# Patient Record
Sex: Female | Born: 1965 | Race: White | Hispanic: No | Marital: Single | Smoking: Never smoker
Health system: Southern US, Community
[De-identification: ages and names within clinical notes are randomized; demographics above are authoritative.]

## PROBLEM LIST (undated history)

## (undated) DIAGNOSIS — F329 Major depressive disorder, single episode, unspecified: Secondary | ICD-10-CM

## (undated) DIAGNOSIS — F419 Anxiety disorder, unspecified: Secondary | ICD-10-CM

## (undated) DIAGNOSIS — F32A Depression, unspecified: Secondary | ICD-10-CM

## (undated) DIAGNOSIS — T7840XA Allergy, unspecified, initial encounter: Secondary | ICD-10-CM

## (undated) HISTORY — DX: Allergy, unspecified, initial encounter: T78.40XA

## (undated) HISTORY — DX: Anxiety disorder, unspecified: F41.9

## (undated) HISTORY — DX: Major depressive disorder, single episode, unspecified: F32.9

## (undated) HISTORY — DX: Depression, unspecified: F32.A

---

## 1999-05-07 ENCOUNTER — Emergency Department (HOSPITAL_COMMUNITY): Admission: EM | Admit: 1999-05-07 | Discharge: 1999-05-07 | Payer: Self-pay | Admitting: Emergency Medicine

## 2000-10-13 ENCOUNTER — Other Ambulatory Visit: Admission: RE | Admit: 2000-10-13 | Discharge: 2000-10-13 | Payer: Self-pay | Admitting: Emergency Medicine

## 2001-11-25 ENCOUNTER — Encounter: Admission: RE | Admit: 2001-11-25 | Discharge: 2001-11-25 | Payer: Self-pay | Admitting: Emergency Medicine

## 2001-11-25 ENCOUNTER — Encounter: Payer: Self-pay | Admitting: Emergency Medicine

## 2002-11-09 HISTORY — PX: BUNIONECTOMY: SHX129

## 2003-12-01 ENCOUNTER — Emergency Department (HOSPITAL_COMMUNITY): Admission: EM | Admit: 2003-12-01 | Discharge: 2003-12-01 | Payer: Self-pay | Admitting: Emergency Medicine

## 2005-11-17 ENCOUNTER — Encounter: Admission: RE | Admit: 2005-11-17 | Discharge: 2005-11-17 | Payer: Self-pay | Admitting: Emergency Medicine

## 2007-04-29 ENCOUNTER — Encounter: Admission: RE | Admit: 2007-04-29 | Discharge: 2007-04-29 | Payer: Self-pay | Admitting: Emergency Medicine

## 2009-12-20 ENCOUNTER — Encounter: Admission: RE | Admit: 2009-12-20 | Discharge: 2009-12-20 | Payer: Self-pay | Admitting: Obstetrics

## 2010-12-04 ENCOUNTER — Other Ambulatory Visit: Payer: Self-pay | Admitting: Obstetrics

## 2010-12-04 DIAGNOSIS — Z1239 Encounter for other screening for malignant neoplasm of breast: Secondary | ICD-10-CM

## 2010-12-26 ENCOUNTER — Ambulatory Visit: Payer: Self-pay

## 2011-01-09 ENCOUNTER — Ambulatory Visit: Payer: Self-pay

## 2011-01-16 ENCOUNTER — Ambulatory Visit
Admission: RE | Admit: 2011-01-16 | Discharge: 2011-01-16 | Disposition: A | Discharge status: Home / Self Care | Payer: 59 | Source: Ambulatory Visit | Attending: Obstetrics | Admitting: Obstetrics

## 2011-01-16 DIAGNOSIS — Z1239 Encounter for other screening for malignant neoplasm of breast: Secondary | ICD-10-CM

## 2012-02-11 ENCOUNTER — Other Ambulatory Visit: Payer: Self-pay | Admitting: Obstetrics

## 2012-02-11 DIAGNOSIS — Z1231 Encounter for screening mammogram for malignant neoplasm of breast: Secondary | ICD-10-CM

## 2012-02-19 ENCOUNTER — Ambulatory Visit
Admission: RE | Admit: 2012-02-19 | Discharge: 2012-02-19 | Disposition: A | Discharge status: Home / Self Care | Payer: 59 | Source: Ambulatory Visit | Attending: Obstetrics | Admitting: Obstetrics

## 2012-02-19 DIAGNOSIS — Z1231 Encounter for screening mammogram for malignant neoplasm of breast: Secondary | ICD-10-CM

## 2012-11-04 ENCOUNTER — Ambulatory Visit: Payer: 59 | Admitting: Emergency Medicine

## 2012-11-04 VITALS — BP 121/71 | HR 76 | Temp 98.1°F | Resp 16 | Ht 66.25 in | Wt 150.8 lb

## 2012-11-04 DIAGNOSIS — J018 Other acute sinusitis: Secondary | ICD-10-CM

## 2012-11-04 DIAGNOSIS — J209 Acute bronchitis, unspecified: Secondary | ICD-10-CM

## 2012-11-04 MED ORDER — AMOXICILLIN-POT CLAVULANATE 875-125 MG PO TABS: 1.0000 | ORAL_TABLET | Freq: Two times a day (BID) | ORAL | Status: DC

## 2012-11-04 MED ORDER — HYDROCOD POLST-CHLORPHEN POLST 10-8 MG/5ML PO LQCR: 5.0000 mL | Freq: Two times a day (BID) | ORAL | Status: DC | PRN

## 2012-11-04 MED ORDER — PSEUDOEPHEDRINE-GUAIFENESIN ER 60-600 MG PO TB12: 1.0000 | ORAL_TABLET | Freq: Two times a day (BID) | ORAL | Status: DC

## 2012-11-04 NOTE — Progress Notes (Signed)
Reviewed and agree.

## 2012-11-04 NOTE — Patient Instructions (Addendum)

## 2012-11-04 NOTE — Progress Notes (Signed)
[facility] [address], [place] [address] [phone]  Date:  11/04/2012   Name:  Laura [name]   DOB:  [birth year]   MRN:  [id number]  PCP:  No primary provider on file.    Chief Complaint: Sinusitis   History of Present Illness:  Laura [name] is a 46 y.o. very pleasant female patient who presents with the following:  Several day history of nasal congestion and purulent drainage and discharge.  Has pressure in cheeks and forehead and feels pressure in eyes. Has scratchy voice and hoarse at times.  Has a cough productive purulent material.  Began after exposure to vermiculite dust on the floor of a trailer on Friday.  No fever but feels chilled.  No improvement with OTC medication.  Has headache and fatigue.  There is no problem list on file for this patient.   Past Medical History  Diagnosis Date  . Allergy   . Anxiety   . Depression     History reviewed. No pertinent past surgical history.  History  Substance Use Topics  . Smoking status: Never Smoker   . Smokeless tobacco: Not on file  . Alcohol Use: No    Family History  Problem Relation Age of Onset  . Stroke Mother   . Scoliosis Mother   . Heart disease Maternal Grandmother   . Stroke Maternal Grandmother   . Cancer Paternal Grandmother   . Heart disease Paternal Grandfather     Allergies  Allergen Reactions  . Flagyl (Metronidazole) Rash    Medication list has been reviewed and updated.  Current Outpatient Prescriptions on File Prior to Visit  Medication Sig Dispense Refill  . lisdexamfetamine (VYVANSE) 50 MG capsule Take 50 mg by mouth every morning.        Review of Systems:  As per HPI, otherwise negative.    Physical Examination: Filed Vitals:   11/04/12 1141  BP: 121/71  Pulse: 76  Temp: 98.1 F (36.7 C)  Resp: 16   Filed Vitals:   11/04/12 1141  Height: 5' 6.25" (1.683 m)  Weight: 150 lb 12.8 oz (68.402 kg)   Body mass index is 24.16 kg/(m^2). Ideal  Body Weight: Weight in (lb) to have BMI = 25: 155.7   GEN: WDWN, NAD, Non-toxic, A & O x 3  No rash or shortness of breath HEENT: Atraumatic, Normocephalic. Neck supple. No masses, No LAD.  Oropharynx negative Ears and Nose: No external deformity.  TM negative.  Green nasal drainage CV: RRR, No M/G/R. No JVD. No thrill. No extra heart sounds. PULM: CTA B, no wheezes, crackles, rhonchi. No retractions. No resp. distress. No accessory muscle use. ABD: S, NT, ND, +BS. No rebound. No HSM. EXTR: No c/c/e NEURO Normal gait.  PSYCH: Normally interactive. Conversant. Not depressed or anxious appearing.  Calm demeanor.    Assessment and Plan: Sinusitis Bronchitis augmentin mucinex tussionex Follow up as needed  Carmelina Dane, MD

## 2013-01-23 ENCOUNTER — Ambulatory Visit: Payer: 59 | Admitting: Family Medicine

## 2013-01-23 VITALS — BP 100/70 | HR 58 | Temp 97.5°F | Resp 16 | Ht 65.5 in | Wt 155.8 lb

## 2013-01-23 DIAGNOSIS — F988 Other specified behavioral and emotional disorders with onset usually occurring in childhood and adolescence: Secondary | ICD-10-CM

## 2013-01-23 MED ORDER — LISDEXAMFETAMINE DIMESYLATE 50 MG PO CAPS: 50.0000 mg | ORAL_CAPSULE | ORAL | Status: DC

## 2013-01-23 NOTE — Progress Notes (Signed)
[facility] [address], [place] [address] [phone]  Date:  01/23/2013   Name:  Laura [name]   DOB:  01-23-1966   MRN:  [id number]  PCP:  No primary provider on file.    Chief Complaint: Medication Refill   History of Present Illness:  Laura [name] is a 47 y.o. very pleasant female patient who presents with the following:  She needs a refill of her vyvanse.  She has been treated at [facility] for some time, but they are now out of network. She will be seen by [facility] counseling soon, but her last appt was canceled due to snow.  She will see Dr. Betti Cruz in a couple of weeks She has been on vyvanse for over a year, and feels that she does better in all aspects of her life with this medication.  She works at [employer].    There is no problem list on file for this patient.   Past Medical History  Diagnosis Date  . Allergy   . Anxiety   . Depression     Past Surgical History  Procedure Laterality Date  . Bunionectomy  2004    R foot    History  Substance Use Topics  . Smoking status: Never Smoker   . Smokeless tobacco: Not on file  . Alcohol Use: No    Family History  Problem Relation Age of Onset  . Stroke Mother   . Scoliosis Mother   . Gallbladder disease Mother   . Heart disease Maternal Grandmother   . Stroke Maternal Grandmother   . Cancer Paternal Grandmother     breast  . Heart disease Paternal Grandfather   . GER disease Daughter   . Gallbladder disease Daughter     Allergies  Allergen Reactions  . Flagyl (Metronidazole) Rash    Medication list has been reviewed and updated.  Current Outpatient Prescriptions on File Prior to Visit  Medication Sig Dispense Refill  . lisdexamfetamine (VYVANSE) 50 MG capsule Take 50 mg by mouth every morning.      Marland Kitchen amoxicillin-clavulanate (AUGMENTIN) 875-125 MG per tablet Take 1 tablet by mouth 2 (two) times daily.  20 tablet  0  . chlorpheniramine-HYDROcodone (TUSSIONEX  PENNKINETIC ER) 10-8 MG/5ML LQCR Take 5 mLs by mouth every 12 (twelve) hours as needed (cough).  60 mL  0  . pseudoephedrine-guaifenesin (MUCINEX D) 60-600 MG per tablet Take 1 tablet by mouth every 12 (twelve) hours.  18 tablet  0   No current facility-administered medications on file prior to visit.    Review of Systems:  As per HPI- otherwise negative.   Physical Examination: Filed Vitals:   01/23/13 1820  BP: 100/70  Pulse: 58  Temp: 97.5 F (36.4 C)  Resp: 16   Filed Vitals:   01/23/13 1820  Height: 5' 5.5" (1.664 m)  Weight: 155 lb 12.8 oz (70.67 kg)   Body mass index is 25.52 kg/(m^2). Ideal Body Weight: Weight in (lb) to have BMI = 25: 152.2  GEN: WDWN, NAD, Non-toxic, A & O x 3 HEENT: Atraumatic, Normocephalic. Neck supple. No masses, No LAD. Ears and Nose: No external deformity. CV: RRR, No M/G/R. No JVD. No thrill. No extra heart sounds. PULM: CTA B, no wheezes, crackles, rhonchi. No retractions. No resp. distress. No accessory muscle use. EXTR: No c/c/e NEURO Normal gait.  PSYCH: Normally interactive. Conversant. Not depressed or anxious appearing.  Calm demeanor.    Assessment and Plan: ADD (  attention deficit disorder) - Plan: lisdexamfetamine (VYVANSE) 50 MG capsule  Refilled her vyvanse for her today, as she has not yet estalihsed with a new physiatric professional.   She will plan to follow- up with her new PCP fur further rx unless she cannot be seen in time- I am willing to refill this once more if needed Aime Meloche, MD

## 2013-01-23 NOTE — Patient Instructions (Addendum)
Let us know if we can do anything to help.  Please see Dr. Betti Cruz as planned

## 2013-02-23 ENCOUNTER — Telehealth: Payer: Self-pay

## 2013-02-23 DIAGNOSIS — F988 Other specified behavioral and emotional disorders with onset usually occurring in childhood and adolescence: Secondary | ICD-10-CM

## 2013-02-23 MED ORDER — LISDEXAMFETAMINE DIMESYLATE 50 MG PO CAPS: 50.0000 mg | ORAL_CAPSULE | ORAL | Status: DC

## 2013-02-23 NOTE — Telephone Encounter (Signed)
Refilled her vyvanse for her today, as she has not yet estalihsed with a new physiatric professional. She will plan to follow- up with her new PCP fur further rx unless she cannot be seen in time- I am willing to refill this once more if needed. Called her, and left message for her to call back and advise on her follow up plans.

## 2013-02-23 NOTE — Telephone Encounter (Signed)
Pt is in need of Rx refill. Call back number is [phone]

## 2013-02-23 NOTE — Telephone Encounter (Signed)
Note from your office visit is below, she has appt on May 5th with the psychiatrist. Would like one more Rx for the Vyvance, I pended it

## 2013-02-23 NOTE — Telephone Encounter (Signed)
That is fine, signed the order.  rx should print and can be placed up front for her.  Called Amy and asked her to place rx up front, called [facility] and let her know rx is ready for her

## 2013-02-24 ENCOUNTER — Other Ambulatory Visit: Payer: Self-pay | Admitting: Radiology

## 2013-02-24 DIAGNOSIS — F988 Other specified behavioral and emotional disorders with onset usually occurring in childhood and adolescence: Secondary | ICD-10-CM

## 2013-02-24 MED ORDER — LISDEXAMFETAMINE DIMESYLATE 50 MG PO CAPS: 50.0000 mg | ORAL_CAPSULE | ORAL | Status: DC

## 2013-03-01 ENCOUNTER — Telehealth: Payer: Self-pay

## 2013-03-01 NOTE — Telephone Encounter (Signed)
Pt spoke to someone yesterday about consent form and was wanting someone to give her a call back. Call back number is [phone]

## 2013-03-01 NOTE — Telephone Encounter (Signed)
LMOM again for patient and told her that we are on the same system on EPIC as Behavioral health in [place]. So if they needed her records, they can view them on Epic. Any records from [facility] needs to be requested from them not through [place] to send to [facility]. We can not legally send any psychiatric notes or records. She will have to contact them to release her records.

## 2013-03-10 ENCOUNTER — Ambulatory Visit (INDEPENDENT_AMBULATORY_CARE_PROVIDER_SITE_OTHER): Payer: 59 | Admitting: Psychiatry

## 2013-03-10 DIAGNOSIS — F988 Other specified behavioral and emotional disorders with onset usually occurring in childhood and adolescence: Secondary | ICD-10-CM

## 2013-03-10 DIAGNOSIS — F909 Attention-deficit hyperactivity disorder, unspecified type: Secondary | ICD-10-CM | POA: Insufficient documentation

## 2013-03-10 MED ORDER — LISDEXAMFETAMINE DIMESYLATE 50 MG PO CAPS: 50.0000 mg | ORAL_CAPSULE | ORAL | Status: DC

## 2013-03-10 NOTE — Progress Notes (Signed)
Psychiatric Assessment Adult  Patient Identification:  Laura [name] Date of Evaluation:  03/10/2013 Chief Complaint: I need a new provider who takes my insurance History of Chief Complaint:  No chief complaint on file. this patient is a 47 year old mother of 3 has been diagnosed and treated with attention deficit disorder since Mar 23, 2011.  She takes Vyvance 50 mg with a good response. This patient has been separated from her husband for the last 2 years and looks forward to getting a divorce. Her husband had been incarcerated. Over the years the patient has gone back to college where she went to [employer] to get a Lexicographer. She did only do this because she was taking medicines for ADD. Patient is planning to go back to college to get a total 4 year degree. At this time she is in no relationship with anybody. She was involved with an old boyfriend who suicided just a year or so ago. The patient has 3 adult children 53, 23 and 20. The 60 year old or who is living with her has a 60-year-old daughter who also lives with the patient. The youngest child 64 year old son is working full-time lives with the patient as well. 2 of her children are being treated for ADD. 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 She likes her job likes her peers and like her boss. It is very stable job. Today the patient denies daily depression. She denies problems with sleep appetite or concentration. She does acknowledge a somewhat reduced energy level. Her concentration and her test performance is much improved since being on the ADD medicine. Patient denies a sense of worthlessness. The patient is not suicidal now and never has been. The patient enjoys multiple things including music gardening and walking her dog. Normally she would get higher reading easily but since being on Vyvanse she likes reading. This patient denies the use of alcohol or drugs. She denies symptoms of psychosis. It should be noted that in 03-22-09  with the death of her boyfriend from suicide she did experience symptoms of depression. She did not entering any specific treatment. The patient denies symptoms of mania. She denies symptoms of a particular anxiety disorder. The patient is completely medically well. She's never really seen a psychiatrist on rub or basis. In Mar 22, 1998 she was hospitalized for 2 days at [facility] after being depressed because of stress around her relationship. For sure. Time she took some Wellbutrin but had no real effects. This patient grew up in [place]. She went to the public school system throughout her elementary school years. Generally she was a fairly good student getting A's and B's. She claims she could concentrate get another hand she said she was quite disorganized. She completely knowledge that she could not sit still. She always call out in class and the difficulty sitting still. She would lose lots of things and she was unable to complete the task unless pressured. Her performance did not change all that much in high school. As adult she feels she's always on the go and unable to relax. The patient has difficulty finishing tasks. She typically starts multiple tasks and never gets them done. At the end of day she feels fatigued and frustrated. The patient denies a hot temper affect instability. She is not impulsive since the she's been on Vyvanse she now finishes task is able to focus and is much less distractible. Medications helps her organize and reduces her frustration.  HPI Review of Systems Physical  Exam  Depressive Symptoms: fatigue,  (Hypo) Manic Symptoms:   Elevated Mood:  No Irritable Mood:  No Grandiosity:  No Distractibility:  No Labiality of Mood:  No Delusions:  No Hallucinations:  No Impulsivity:  No Sexually Inappropriate Behavior:  No Financial Extravagance:  No Flight of Ideas:  No  Anxiety Symptoms: Excessive Worry:  No Panic Symptoms:  No Agoraphobia:  No Obsessive Compulsive:  No  Symptoms: None, Specific Phobias:  No Social Anxiety:  No  Psychotic Symptoms:  Hallucinations: No None Delusions:  No Paranoia:  No   Ideas of Reference:  No  PTSD Symptoms: Ever had a traumatic exposure:  No Had a traumatic exposure in the last month:  No Re-experiencing: No  Hypervigilance:   Hyperarousal:   Avoidance:  None  Traumatic Brain Injury: No   Past Psychiatric History: Diagnosis: ADHD  Hospitalizations:1 x  1999  Outpatient Care:   Substance Abuse Care:   Self-Mutilation:   Suicidal Attempts:   Violent Behaviors:    Past Medical History:   Past Medical History  Diagnosis Date  . Allergy   . Anxiety   . Depression    History of Loss of Consciousness:   Seizure History:  No Cardiac History:  No Allergies:   Allergies  Allergen Reactions  . Flagyl (Metronidazole) Rash   Current Medications:  Current Outpatient Prescriptions  Medication Sig Dispense Refill  . amoxicillin-clavulanate (AUGMENTIN) 875-125 MG per tablet Take 1 tablet by mouth 2 (two) times daily.  20 tablet  0  . chlorpheniramine-HYDROcodone (TUSSIONEX PENNKINETIC ER) 10-8 MG/5ML LQCR Take 5 mLs by mouth every 12 (twelve) hours as needed (cough).  60 mL  0  . lisdexamfetamine (VYVANSE) 50 MG capsule Take 1 capsule (50 mg total) by mouth every morning.  30 capsule  0  . lisdexamfetamine (VYVANSE) 50 MG capsule Take 1 capsule (50 mg total) by mouth every morning.  30 capsule  0  . pseudoephedrine-guaifenesin (MUCINEX D) 60-600 MG per tablet Take 1 tablet by mouth every 12 (twelve) hours.  18 tablet  0   No current facility-administered medications for this visit.    Previous Psychotropic Medications:  Medication Dose   Vyvanse 50                       Substance Abuse History in the last 12 months: Substance Age of 1st Use Last Use Amount Specific Type    Medical Consequences of Substance Abuse:   Legal Consequences of Substance Abuse:   Family Consequences of  Substance Abuse:   Blackouts:   DT's:   Withdrawal Symptoms:   None  Social History: Current Place of Residence: [place] of Birth:  Family Members:  Marital Status:  Separated Children: 3  Sons:   Daughters:  Relationships:  Education:  Corporate treasurer Problems/Performance:  Religious Beliefs/Practices:  History of Abuse:  Teacher, music History:   Legal History:  Hobbies/Interests:   Family History:   Family History  Problem Relation Age of Onset  . Stroke Mother   . Scoliosis Mother   . Gallbladder disease Mother   . Heart disease Maternal Grandmother   . Stroke Maternal Grandmother   . Cancer Paternal Grandmother     breast  . Heart disease Paternal Grandfather   . GER disease Daughter   . Gallbladder disease Daughter     Mental Status Examination/Evaluation: Objective:  Appearance: Fairly Groomed  Eye Contact::  Good  Speech:  Clear and Coherent  Volume:  Normal  Mood:  good  Affect:  Congruent  Thought Process:  Goal Directed  Orientation:  Full (Time, Place, and Person)  Thought Content:  WDL  Suicidal Thoughts:  No  Homicidal Thoughts:  No  Judgement:  Good  Insight:  Good  Psychomotor Activity:  Normal  Akathisia:  No  Handed:  Right  AIMS (if indicated):    Assets:  Desire for Improvement    Laboratory/X-Ray Psychological Evaluation(s)        Assessment:  Axis I: ADHD, inattentive type  AXIS I ADHD, inattentive type  AXIS II Deferred  AXIS III Past Medical History  Diagnosis Date  . Allergy   . Anxiety   . Depression      AXIS IV educational problems  AXIS V 61-70 mild symptoms   Treatment Plan/Recommendations:  Plan of Care: at this time I will go ahead and restart her Vyvanse 50 mg daily  Laboratory:    Psychotherapy:   Medications:   Routine PRN Medications:    Consultations:   Safety Concerns:    Other:      Lucas Mallow, MD 5/2/201410:37 AM

## 2013-05-19 ENCOUNTER — Ambulatory Visit (INDEPENDENT_AMBULATORY_CARE_PROVIDER_SITE_OTHER): Payer: 59 | Admitting: Psychiatry

## 2013-05-19 DIAGNOSIS — F909 Attention-deficit hyperactivity disorder, unspecified type: Secondary | ICD-10-CM

## 2013-05-19 DIAGNOSIS — F988 Other specified behavioral and emotional disorders with onset usually occurring in childhood and adolescence: Secondary | ICD-10-CM

## 2013-05-19 MED ORDER — LISDEXAMFETAMINE DIMESYLATE 70 MG PO CAPS: 70.0000 mg | ORAL_CAPSULE | ORAL | Status: DC

## 2013-05-19 MED ORDER — LISDEXAMFETAMINE DIMESYLATE 50 MG PO CAPS: 70.0000 mg | ORAL_CAPSULE | ORAL | Status: DC

## 2013-05-19 NOTE — Progress Notes (Signed)
[facility] MD Progress Note  05/19/2013 11:19 AM Laura [name]  MRN:  [id number] Subjective: unfortunately very late Diagnosis:  Axis I: ADHD, hyperactive type Apparently due to a problem with our computer the patient was late I coming at 11:00 instead of 9. The patient fortunately is doing very well. Her mood is good. She is sleeping and eating well. She lives with 2 over adult children. She also lives with a 48-year-old granddaughter. Patient says the finances working fairly well but she is always wondered what it is to be on the maximum dose. I think this is a reasonable consideration. This patient does not drink or use any drugs. The patient is very active. She is a Chief Executive Officer works full-time. She enjoys spending time with her dog being outside reading and listening to music. He loves being outdoors. The patient is not in a relationship at this time. Today we discussed the idea of increasing her Vyvance to the maximum dose of 70 mg which is exactly what we shall do. ADL's:  Intact  Sleep: Good  Appetite:  Good  Suicidal Ideation:  no Homicidal Ideation:  no AEB (as evidenced by):  Psychiatric Specialty Exam: ROS  There were no vitals taken for this visit.There is no weight on file to calculate BMI.  General Appearance: Casual  Eye Contact::  Good  Speech:  Clear and Coherent  Volume:  Decreased  Mood:  Euthymic  Affect:  Congruent  Thought Process:  Coherent  Orientation:  Full (Time, Place, and Person)  Thought Content:  WDL  Suicidal Thoughts:  No  Homicidal Thoughts:  No  Memory:  nl  Judgement:  Good  Insight:  Good  Psychomotor Activity:  Normal  Concentration:  Good  Recall:  Good  Akathisia:  No  Handed:  Right  AIMS (if indicated):     Assets:  Desire for Improvement  Sleep:      Current Medications: Current Outpatient Prescriptions  Medication Sig Dispense Refill  . amoxicillin-clavulanate (AUGMENTIN) 875-125 MG per tablet Take 1 tablet by mouth 2 (two) times  daily.  20 tablet  0  . chlorpheniramine-HYDROcodone (TUSSIONEX PENNKINETIC ER) 10-8 MG/5ML LQCR Take 5 mLs by mouth every 12 (twelve) hours as needed (cough).  60 mL  0  . lisdexamfetamine (VYVANSE) 50 MG capsule Take 1 capsule (50 mg total) by mouth every morning.  30 capsule  0  . lisdexamfetamine (VYVANSE) 70 MG capsule Take 1 capsule (70 mg total) by mouth every morning. Fill after 06/18/2013  30 capsule  0  . pseudoephedrine-guaifenesin (MUCINEX D) 60-600 MG per tablet Take 1 tablet by mouth every 12 (twelve) hours.  18 tablet  0   No current facility-administered medications for this visit.    Lab Results: No results found for this or any previous visit (from the past 48 hour(s)).  Physical Findings: AIMS:  , ,  ,  ,    CIWA:    COWS:     Treatment Plan Summary: Medication management  Plan:at this time we'll go ahead and increase her Vyvanc to 70 mg. The patient return to see me in 9 weeks.  Medical Decision Making Problem Points:  Established problem, worsening (2) Data Points:  Review of new medications or change in dosage (2)  I certify that inpatient services furnished can reasonably be expected to improve the patient's condition.   Luisangel Wainright IRVING 05/19/2013, 11:19 AM

## 2013-06-01 ENCOUNTER — Telehealth (HOSPITAL_COMMUNITY): Payer: Self-pay | Admitting: *Deleted

## 2013-06-01 NOTE — Telephone Encounter (Signed)
Called for prior authorization of Vyvanse, approval given for 12 months by Tyrane. Faxed to pharmacy and attempted to call patient, pt answered but lost cell phone service during call.

## 2013-07-28 ENCOUNTER — Ambulatory Visit (HOSPITAL_COMMUNITY): Payer: Self-pay | Admitting: Psychiatry

## 2013-08-10 ENCOUNTER — Other Ambulatory Visit (HOSPITAL_COMMUNITY): Payer: Self-pay | Admitting: *Deleted

## 2013-08-10 DIAGNOSIS — F988 Other specified behavioral and emotional disorders with onset usually occurring in childhood and adolescence: Secondary | ICD-10-CM

## 2013-08-10 MED ORDER — LISDEXAMFETAMINE DIMESYLATE 70 MG PO CAPS: 70.0000 mg | ORAL_CAPSULE | ORAL | Status: DC

## 2013-08-11 ENCOUNTER — Other Ambulatory Visit (HOSPITAL_COMMUNITY): Payer: Self-pay | Admitting: Psychiatry

## 2013-08-11 ENCOUNTER — Telehealth (HOSPITAL_COMMUNITY): Payer: Self-pay

## 2013-08-11 DIAGNOSIS — F988 Other specified behavioral and emotional disorders with onset usually occurring in childhood and adolescence: Secondary | ICD-10-CM

## 2013-08-11 MED ORDER — LISDEXAMFETAMINE DIMESYLATE 70 MG PO CAPS: 70.0000 mg | ORAL_CAPSULE | ORAL | Status: DC

## 2013-08-11 NOTE — Telephone Encounter (Signed)
08/11/13 10:46am Patient came to pick-up rx script.Laura KitchenMarguerite Olea

## 2013-08-11 NOTE — Telephone Encounter (Signed)
Called  For  refill

## 2013-08-15 ENCOUNTER — Other Ambulatory Visit: Payer: Self-pay

## 2013-08-15 DIAGNOSIS — Z1231 Encounter for screening mammogram for malignant neoplasm of breast: Secondary | ICD-10-CM

## 2013-08-29 ENCOUNTER — Ambulatory Visit
Admission: RE | Admit: 2013-08-29 | Discharge: 2013-08-29 | Disposition: A | Discharge status: Home / Self Care | Payer: 59 | Source: Ambulatory Visit

## 2013-08-29 DIAGNOSIS — Z1231 Encounter for screening mammogram for malignant neoplasm of breast: Secondary | ICD-10-CM

## 2013-09-01 ENCOUNTER — Ambulatory Visit (INDEPENDENT_AMBULATORY_CARE_PROVIDER_SITE_OTHER): Payer: 59 | Admitting: Psychiatry

## 2013-09-01 DIAGNOSIS — F988 Other specified behavioral and emotional disorders with onset usually occurring in childhood and adolescence: Secondary | ICD-10-CM | POA: Insufficient documentation

## 2013-09-01 MED ORDER — LISDEXAMFETAMINE DIMESYLATE 70 MG PO CAPS: 70.0000 mg | ORAL_CAPSULE | ORAL | Status: DC

## 2013-09-01 NOTE — Progress Notes (Signed)
[facility] MD Progress Note  09/01/2013 10:14 AM Laura [name]  MRN:  [id number] Subjective:  Really tired Today the patient is seen one time. She shares that she feels very fatigued over the last 5 months. A close evaluation it is apparent, and the patient acknowledges this that she's not sleeping enough. She's not going to bed early enough. She 47 years old busy doing a lot of different things and goes to bed too late ending up getting only 5 hours of sleep. She says normally she works best on 7 or 8 hours of sleep. I suspect the patient actually feels sleepy during the day. She does not take naps. The patient continues taking Vyvanse and does get a benefit from it. She's not seen a dramatic affect from increasing the dose from 50 to a 70 mg dose. Nonetheless she says that it clearly is helpful with organization and focus. The patient denies being depressed. She denies anxiety. The patient works part time. She lives with her 2 adult children who both work and they all take care of a 43 and a half-year-old granddaughter. The patient does feel a significant amount of financial stress at this time. The patient is going to be looking for primary care physician. This patient denies the use of drugs or alcohol. At this time she feels fairly stable other than excessive fatigue. The patient will try to adjust the time that she goes to bed at night. Diagnosis:   DSM5: Schizophrenia Disorders:   Obsessive-Compulsive Disorders:   Trauma-Stressor Disorders:   Substance/Addictive Disorders:   Depressive Disorders:    Axis I: ADHD, inattentive type  ADL's:  Intact  Sleep: Good  Appetite:  Good  Suicidal Ideation:  no Homicidal Ideation:  none AEB (as evidenced by):  Psychiatric Specialty Exam: ROS  There were no vitals taken for this visit.There is no weight on file to calculate BMI.  General Appearance: Casual  Eye Contact::  Good  Speech:  Normal Rate  Volume:  Normal  Mood:  Euthymic  Affect:  NA   Thought Process:  Coherent  Orientation:  Full (Time, Place, and Person)  Thought Content:  WDL  Suicidal Thoughts:  No  Homicidal Thoughts:  No  Memory:  nl  Judgement:  Good  Insight:  Good  Psychomotor Activity:  Normal  Concentration:  Good  Recall:  Good  Akathisia:  No  Handed:  Right  AIMS (if indicated):     Assets:  Desire for Improvement  Sleep:      Current Medications: Current Outpatient Prescriptions  Medication Sig Dispense Refill  . amoxicillin-clavulanate (AUGMENTIN) 875-125 MG per tablet Take 1 tablet by mouth 2 (two) times daily.  20 tablet  0  . chlorpheniramine-HYDROcodone (TUSSIONEX PENNKINETIC ER) 10-8 MG/5ML LQCR Take 5 mLs by mouth every 12 (twelve) hours as needed (cough).  60 mL  0  . lisdexamfetamine (VYVANSE) 70 MG capsule Take 1 capsule (70 mg total) by mouth every morning.  30 capsule  0  . lisdexamfetamine (VYVANSE) 70 MG capsule Take 1 capsule (70 mg total) by mouth every morning. Fill after 11/20 /2014  30 capsule  0  . lisdexamfetamine (VYVANSE) 70 MG capsule Take 1 capsule (70 mg total) by mouth every morning. Fill after 10/28/2013  30 capsule  0  . pseudoephedrine-guaifenesin (MUCINEX D) 60-600 MG per tablet Take 1 tablet by mouth every 12 (twelve) hours.  18 tablet  0   No current facility-administered medications for this visit.    Lab  Results: No results found for this or any previous visit (from the past 48 hour(s)).  Physical Findings: AIMS:  , ,  ,  ,    CIWA:    COWS:     Treatment Plan Summary: At this time the patient will continue taking Vyvanse 70 mg a day. The patient will adjust the time that she goes to bed at night and get more sleep. Today we reviewed the pros and cons of her medications and she agreed to take them as prescribed. This patient return to see me in 3 months.  Plan:  Medical Decision Making Problem Points:  Established problem, stable/improving (1) Data Points:  Review of medication regiment & side effects  (2)  I certify that inpatient services furnished can reasonably be expected to improve the patient's condition.   [name] 09/01/2013, 10:14 AM

## 2013-09-22 ENCOUNTER — Ambulatory Visit (INDEPENDENT_AMBULATORY_CARE_PROVIDER_SITE_OTHER): Payer: 59 | Admitting: Family Medicine

## 2013-09-22 DIAGNOSIS — S139XXA Sprain of joints and ligaments of unspecified parts of neck, initial encounter: Secondary | ICD-10-CM

## 2013-09-22 DIAGNOSIS — S335XXA Sprain of ligaments of lumbar spine, initial encounter: Secondary | ICD-10-CM

## 2013-09-22 DIAGNOSIS — S161XXA Strain of muscle, fascia and tendon at neck level, initial encounter: Secondary | ICD-10-CM

## 2013-09-22 DIAGNOSIS — S39012A Strain of muscle, fascia and tendon of lower back, initial encounter: Secondary | ICD-10-CM

## 2013-09-22 MED ORDER — METHOCARBAMOL 750 MG PO TABS: ORAL_TABLET | ORAL | Status: DC

## 2013-09-22 MED ORDER — HYDROCODONE-ACETAMINOPHEN 5-325 MG PO TABS: 1.0000 | ORAL_TABLET | ORAL | Status: DC | PRN

## 2013-09-22 MED ORDER — NAPROXEN 500 MG PO TABS: 500.0000 mg | ORAL_TABLET | Freq: Two times a day (BID) | ORAL | Status: DC
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
## 2013-09-22 NOTE — Patient Instructions (Signed)
Ice alternate with heat about 15 minutes each several times a day.  Do gentle stretching  Return if worse or if not improving in next 5 or 6 days  Take muscle relaxants Robaxin (methocarbamol), one in the morning, 1 in the afternoon, and 2 at bedtime  Take naproxen one twice daily for pain and inflammation.  Take the hydrocodone every 4-6 hours if needed for severe pain

## 2013-11-27 ENCOUNTER — Encounter (HOSPITAL_COMMUNITY): Payer: Self-pay | Admitting: Emergency Medicine

## 2013-11-27 ENCOUNTER — Emergency Department (HOSPITAL_COMMUNITY): Payer: 59

## 2013-11-27 ENCOUNTER — Emergency Department (HOSPITAL_COMMUNITY)
Admission: EM | Admit: 2013-11-27 | Discharge: 2013-11-27 | Disposition: A | Discharge status: Home / Self Care | Payer: 59 | Attending: Emergency Medicine | Admitting: Emergency Medicine

## 2013-11-27 DIAGNOSIS — M25559 Pain in unspecified hip: Secondary | ICD-10-CM

## 2013-11-27 DIAGNOSIS — F411 Generalized anxiety disorder: Secondary | ICD-10-CM | POA: Insufficient documentation

## 2013-11-27 DIAGNOSIS — F329 Major depressive disorder, single episode, unspecified: Secondary | ICD-10-CM | POA: Insufficient documentation

## 2013-11-27 DIAGNOSIS — Z79899 Other long term (current) drug therapy: Secondary | ICD-10-CM | POA: Insufficient documentation

## 2013-11-27 DIAGNOSIS — G8911 Acute pain due to trauma: Secondary | ICD-10-CM | POA: Insufficient documentation

## 2013-11-27 DIAGNOSIS — Z87828 Personal history of other (healed) physical injury and trauma: Secondary | ICD-10-CM | POA: Insufficient documentation

## 2013-11-27 DIAGNOSIS — F3289 Other specified depressive episodes: Secondary | ICD-10-CM | POA: Insufficient documentation

## 2013-11-27 MED ORDER — TRAMADOL HCL 50 MG PO TABS: 50.0000 mg | ORAL_TABLET | Freq: Four times a day (QID) | ORAL | Status: DC | PRN
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
## 2013-11-27 NOTE — Discharge Instructions (Signed)
Arthralgia  Your caregiver has diagnosed you as suffering from an arthralgia. Arthralgia means there is pain in a joint. This can come from many reasons including:  · Bruising the joint which causes soreness (inflammation) in the joint.  · Wear and tear on the joints which occur as we grow older (osteoarthritis).  · Overusing the joint.  · Various forms of arthritis.  · Infections of the joint.  Regardless of the cause of pain in your joint, most of these different pains respond to anti-inflammatory drugs and rest. The exception to this is when a joint is infected, and these cases are treated with antibiotics, if it is a bacterial infection.  HOME CARE INSTRUCTIONS   · Rest the injured area for as long as directed by your caregiver. Then slowly start using the joint as directed by your caregiver and as the pain allows. Crutches as directed may be useful if the ankles, knees or hips are involved. If the knee was splinted or casted, continue use and care as directed. If an stretchy or elastic wrapping bandage has been applied today, it should be removed and re-applied every 3 to 4 hours. It should not be applied tightly, but firmly enough to keep swelling down. Watch toes and feet for swelling, bluish discoloration, coldness, numbness or excessive pain. If any of these problems (symptoms) occur, remove the ace bandage and re-apply more loosely. If these symptoms persist, contact your caregiver or return to this location.  · For the first 24 hours, keep the injured extremity elevated on pillows while lying down.  · Apply ice for 15-20 minutes to the sore joint every couple hours while awake for the first half day. Then 03-04 times per day for the first 48 hours. Put the ice in a plastic bag and place a towel between the bag of ice and your skin.  · Wear any splinting, casting, elastic bandage applications, or slings as instructed.  · Only take over-the-counter or prescription medicines for pain, discomfort, or fever as  directed by your caregiver. Do not use aspirin immediately after the injury unless instructed by your physician. Aspirin can cause increased bleeding and bruising of the tissues.  · If you were given crutches, continue to use them as instructed and do not resume weight bearing on the sore joint until instructed.  Persistent pain and inability to use the sore joint as directed for more than 2 to 3 days are warning signs indicating that you should see a caregiver for a follow-up visit as soon as possible. Initially, a hairline fracture (break in bone) may not be evident on X-rays. Persistent pain and swelling indicate that further evaluation, non-weight bearing or use of the joint (use of crutches or slings as instructed), or further X-rays are indicated. X-rays may sometimes not show a small fracture until a week or 10 days later. Make a follow-up appointment with your own caregiver or one to whom we have referred you. A radiologist (specialist in reading X-rays) may read your X-rays. Make sure you know how you are to obtain your X-ray results. Do not assume everything is normal if you do not hear from us.  SEEK MEDICAL CARE IF:  Bruising, swelling, or pain increases.  SEEK IMMEDIATE MEDICAL CARE IF:   · Your fingers or toes are numb or blue.  · The pain is not responding to medications and continues to stay the same or get worse.  · The pain in your joint becomes severe.  · You   develop a fever over 102° F (38.9° C).  · It becomes impossible to move or use the joint.  MAKE SURE YOU:   · Understand these instructions.  · Will watch your condition.  · Will get help right away if you are not doing well or get worse.  Document Released: 10/26/2005 Document Revised: 01/18/2012 Document Reviewed: 06/13/2008  ExitCare® Patient Information ©2014 [employer].

## 2013-11-29 NOTE — ED Provider Notes (Signed)
Medical screening examination/treatment/procedure(s) were performed by non-physician practitioner and as supervising physician I was immediately available for consultation/collaboration.  EKG Interpretation   None        Juliet RudeNathan R. Rubin PayorPickering, MD 11/29/13 [phone]

## 2013-12-08 ENCOUNTER — Ambulatory Visit (INDEPENDENT_AMBULATORY_CARE_PROVIDER_SITE_OTHER): Payer: 59 | Admitting: Psychiatry

## 2013-12-08 DIAGNOSIS — F988 Other specified behavioral and emotional disorders with onset usually occurring in childhood and adolescence: Secondary | ICD-10-CM

## 2013-12-08 DIAGNOSIS — F909 Attention-deficit hyperactivity disorder, unspecified type: Secondary | ICD-10-CM

## 2013-12-08 MED ORDER — LISDEXAMFETAMINE DIMESYLATE 70 MG PO CAPS: 70.0000 mg | ORAL_CAPSULE | ORAL | Status: DC
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
## 2014-01-11 ENCOUNTER — Ambulatory Visit: Payer: 59 | Admitting: Emergency Medicine

## 2014-01-11 ENCOUNTER — Ambulatory Visit: Payer: 59

## 2014-01-11 VITALS — BP 102/54 | HR 82 | Temp 98.3°F | Resp 16 | Ht 65.0 in | Wt 146.0 lb

## 2014-01-11 DIAGNOSIS — R1013 Epigastric pain: Secondary | ICD-10-CM

## 2014-01-11 DIAGNOSIS — A088 Other specified intestinal infections: Secondary | ICD-10-CM

## 2014-01-11 LAB — COMPREHENSIVE METABOLIC PANEL
ALK PHOS: 50 U/L (ref 39–117)
ALT: 14 U/L (ref 0–35)
AST: 19 U/L (ref 0–37)
Albumin: 3.8 g/dL (ref 3.5–5.2)
BILIRUBIN TOTAL: 0.2 mg/dL (ref 0.2–1.2)
BUN: 17 mg/dL (ref 6–23)
CO2: 29 mEq/L (ref 19–32)
CREATININE: 0.81 mg/dL (ref 0.50–1.10)
Calcium: 9.2 mg/dL (ref 8.4–10.5)
Chloride: 102 mEq/L (ref 96–112)
Glucose, Bld: 82 mg/dL (ref 70–99)
Potassium: 4.6 mEq/L (ref 3.5–5.3)
Sodium: 139 mEq/L (ref 135–145)
Total Protein: 6.9 g/dL (ref 6.0–8.3)

## 2014-01-11 LAB — POCT CBC
Granulocyte percent: 70.5 %G (ref 37–80)
HCT, POC: 41.9 % (ref 37.7–47.9)
Hemoglobin: 12.9 g/dL (ref 12.2–16.2)
LYMPH, POC: 1.4 (ref 0.6–3.4)
MCH: 28.6 pg (ref 27–31.2)
MCHC: 30.8 g/dL — AB (ref 31.8–35.4)
MCV: 92.9 fL (ref 80–97)
MID (CBC): 0.6 (ref 0–0.9)
MPV: 10.2 fL (ref 0–99.8)
PLATELET COUNT, POC: 154 10*3/uL (ref 142–424)
POC Granulocyte: 4.7 (ref 2–6.9)
POC LYMPH PERCENT: 21.2 %L (ref 10–50)
POC MID %: 8.3 % (ref 0–12)
RBC: 4.51 M/uL (ref 4.04–5.48)
RDW, POC: 15.9 %
WBC: 6.7 10*3/uL (ref 4.6–10.2)

## 2014-01-11 LAB — AMYLASE: AMYLASE: 41 U/L (ref 0–105)

## 2014-01-11 LAB — LIPASE: LIPASE: 11 U/L (ref 0–75)

## 2014-01-11 MED ORDER — ONDANSETRON 4 MG PO TBDP
8.0000 mg | ORAL_TABLET | Freq: Once | ORAL | Status: AC
  Administered 2014-01-11: 8 mg via ORAL

## 2014-01-11 MED ORDER — ONDANSETRON 4 MG PO TBDP
4.0000 mg | ORAL_TABLET | Freq: Once | ORAL | Status: AC
  Administered 2014-01-11: 4 mg via ORAL

## 2014-01-11 MED ORDER — LOPERAMIDE HCL 2 MG PO TABS: ORAL_TABLET | ORAL | Status: DC

## 2014-01-11 MED ORDER — ONDANSETRON 8 MG PO TBDP: 8.0000 mg | ORAL_TABLET | Freq: Three times a day (TID) | ORAL | Status: DC | PRN

## 2014-01-11 NOTE — Patient Instructions (Signed)
Diet The clear liquid diet consists of foods that are liquid or will become liquid at room temperature. Examples of foods allowed on a clear liquid diet include fruit juice, broth or bouillon, gelatin, or frozen ice pops. You should be able to see through the liquid. The purpose of this diet is to provide the necessary fluids, electrolytes (such as sodium and potassium), and energy to keep the body functioning during times when you are not able to consume a regular diet. A clear liquid diet should not be continued for long periods of time, as it is not nutritionally adequate.  A CLEAR LIQUID DIET MAY BE NEEDED:  When a sudden-onset (acute) condition occurs before or after surgery.   As the first step in oral feeding.   For fluid and electrolyte replacement in diarrheal diseases.   As a diet before certain medical tests are performed.  ADEQUACY The clear liquid diet is adequate only in ascorbic acid, according to the Recommended Dietary Allowances of the National Research Council.  CHOOSING FOODS Breads and Starches  Allowed: None are allowed.   Avoid: All are to be avoided.  Vegetables  Allowed: Strained vegetable juices.   Avoid: Any others.  Fruit  Allowed: Strained fruit juices and fruit drinks. Include 1 serving of citrus or vitamin C-enriched fruit juice daily.   Avoid: Any others.  Meat and Meat Substitutes  Allowed: None are allowed.   Avoid: All are to be avoided.  Milk Products  Allowed: None are allowed.   Avoid: All are to be avoided.  Soups and Combination Foods  Allowed: Clear bouillon, broth, or strained broth-based soups.   Avoid: Any others.  Desserts and Sweets  Allowed: Sugar, honey. High-protein gelatin. Flavored gelatin, ices, or frozen ice pops that do not contain milk.   Avoid: Any others.  Fats and Oils  Allowed: None are allowed.   Avoid: All are to be avoided.  Beverages  Allowed: Cereal  beverages, coffee (regular or decaffeinated), tea, or soda at the discretion of your health care provider.   Avoid: Any others.  Condiments  Allowed: Salt.   Avoid: Any others, including pepper.  Supplements  Allowed: Liquid nutrition beverages that you can see through.   Avoid: Any others that contain lactose or fiber. SAMPLE MEAL PLAN Breakfast  4 oz (120 mL) strained orange juice.   to 1 cup (120 to 240 mL) gelatin (plain or fortified).  1 cup (240 mL) beverage (coffee or tea).  Sugar, if desired. Midmorning Snack   cup (120 mL) gelatin (plain or fortified). Lunch  1 cup (240 mL) broth or consomm.  4 oz (120 mL) strained grapefruit juice.   cup (120 mL) gelatin (plain or fortified).  1 cup (240 mL) beverage (coffee or tea).  Sugar, if desired. Midafternoon Snack   cup (120 mL) fruit ice.   cup (120 mL) strained fruit juice. Dinner  1 cup (240 mL) broth or consomm.   cup (120 mL) cranberry juice.   cup (120 mL) flavored gelatin (plain or fortified).  1 cup (240 mL) beverage (coffee or tea).  Sugar, if desired. Evening Snack  4 oz (120 mL) strained apple juice (vitamin C-fortified).   cup (120 mL) flavored gelatin (plain or fortified). MAKE SURE YOU:  Understand these instructions.  Will watch your child's condition.  Will get help right away if your child is not doing well or gets worse. Document Released: 10/26/2005 Document Revised: 06/28/2013 Document Reviewed: 03/28/2013 ExitCare Patient Information 2014 [employer]. Viral   Gastroenteritis Viral gastroenteritis is also known as stomach flu. This condition affects the stomach and intestinal tract. It can cause sudden diarrhea and vomiting. The illness typically lasts 3 to 8 days. Most people develop an immune response that eventually gets rid of the virus. While this natural response develops, the virus can make you quite ill. CAUSES  Many different viruses can cause  gastroenteritis, such as rotavirus or noroviruses. You can catch one of these viruses by consuming contaminated food or water. You may also catch a virus by sharing utensils or other personal items with an infected person or by touching a contaminated surface. SYMPTOMS  The most common symptoms are diarrhea and vomiting. These problems can cause a severe loss of body fluids (dehydration) and a body salt (electrolyte) imbalance. Other symptoms may include:  Fever.  Headache.  Fatigue.  Abdominal pain. DIAGNOSIS  Your caregiver can usually diagnose viral gastroenteritis based on your symptoms and a physical exam. A stool sample may also be taken to test for the presence of viruses or other infections. TREATMENT  This illness typically goes away on its own. Treatments are aimed at rehydration. The most serious cases of viral gastroenteritis involve vomiting so severely that you are not able to keep fluids down. In these cases, fluids must be given through an intravenous line (IV). HOME CARE INSTRUCTIONS   Drink enough fluids to keep your urine clear or pale yellow. Drink small amounts of fluids frequently and increase the amounts as tolerated.  Ask your caregiver for specific rehydration instructions.  Avoid:  Foods high in sugar.  Alcohol.  Carbonated drinks.  Tobacco.  Juice.  Caffeine drinks.  Extremely hot or cold fluids.  Fatty, greasy foods.  Too much intake of anything at one time.  Dairy products until 24 to 48 hours after diarrhea stops.  You may consume probiotics. Probiotics are active cultures of beneficial bacteria. They may lessen the amount and number of diarrheal stools in adults. Probiotics can be found in yogurt with active cultures and in supplements.  Wash your hands well to avoid spreading the virus.  Only take over-the-counter or prescription medicines for pain, discomfort, or fever as directed by your caregiver. Do not give aspirin to children.  Antidiarrheal medicines are not recommended.  Ask your caregiver if you should continue to take your regular prescribed and over-the-counter medicines.  Keep all follow-up appointments as directed by your caregiver. SEEK IMMEDIATE MEDICAL CARE IF:   You are unable to keep fluids down.  You do not urinate at least once every 6 to 8 hours.  You develop shortness of breath.  You notice blood in your stool or vomit. This may look like coffee grounds.  You have abdominal pain that increases or is concentrated in one small area (localized).  You have persistent vomiting or diarrhea.  You have a fever.  The patient is a child younger than 3 months, and he or she has a fever.  The patient is a child older than 3 months, and he or she has a fever and persistent symptoms.  The patient is a child older than 3 months, and he or she has a fever and symptoms suddenly get worse.  The patient is a baby, and he or she has no tears when crying. MAKE SURE YOU:   Understand these instructions.  Will watch your condition.  Will get help right away if you are not doing well or get worse. Document Released: 10/26/2005 Document Revised: 01/18/2012 Document Reviewed: 08/12/2011   ExitCare Patient Information 2014 [employer].  

## 2014-01-11 NOTE — Progress Notes (Addendum)
[facility] [address], [place] [address] [id number]  Date:  01/11/2014   Name:  Laura [name]   DOB:  19-Oct-1966   MRN:  [id number]  PCP:  No PCP Per Patient    Chief Complaint: Abdominal Pain, Nausea, Headache and Emesis   History of Present Illness:  Laura [name] is a 48 y.o. very pleasant female patient who presents with the following:  Ill since yesterday with epigastric pain and nausea and vomiting.  No stool change.  Says paroxysmal and no affected by diet.  Non smoker or drinker.  Was on NDAID until a couple days ago.  No cough with clear sputum and no coryza.  No fever or chills.  No history of PUD or GERD.  No improvement with over the counter medications or other home remedies. Denies other complaint or health concern today.   Patient Active Problem List   Diagnosis Date Noted  . Attention deficit disorder without mention of hyperactivity 09/01/2013  . Attention deficit disorder with hyperactivity(314.01) 03/10/2013  . ADD (attention deficit disorder) 01/23/2013    Past Medical History  Diagnosis Date  . Allergy   . Anxiety   . Depression     Past Surgical History  Procedure Laterality Date  . Bunionectomy  2004    R foot    History  Substance Use Topics  . Smoking status: Never Smoker   . Smokeless tobacco: Not on file  . Alcohol Use: No    Family History  Problem Relation Age of Onset  . Stroke Mother   . Scoliosis Mother   . Gallbladder disease Mother   . Heart disease Maternal Grandmother   . Stroke Maternal Grandmother   . Cancer Paternal Grandmother     breast  . Heart disease Paternal Grandfather   . GER disease Daughter   . Gallbladder disease Daughter     Allergies  Allergen Reactions  . Flagyl [Metronidazole] Rash    Medication list has been reviewed and updated.  Current Outpatient Prescriptions on File Prior to Visit  Medication Sig Dispense Refill  . lisdexamfetamine (VYVANSE) 70 MG capsule Take 1  capsule (70 mg total) by mouth every morning. Fill after 01/31/2014  30 capsule  0  . traMADol (ULTRAM) 50 MG tablet Take 1 tablet (50 mg total) by mouth every 6 (six) hours as needed.  15 tablet  0   No current facility-administered medications on file prior to visit.    Review of Systems:  As per HPI, otherwise negative.    Physical Examination: Filed Vitals:   01/11/14 1449  BP: 102/54  Pulse: 82  Temp: 98.3 F (36.8 C)  Resp: 16   Filed Vitals:   01/11/14 1449  Height: 5\' 5"  (1.651 m)  Weight: 146 lb (66.225 kg)   Body mass index is 24.3 kg/(m^2). Ideal Body Weight: Weight in (lb) to have BMI = 25: 149.9  GEN: WDWN, NAD, Non-toxic, A & O x 3 HEENT: Atraumatic, Normocephalic. Neck supple. No masses, No LAD. Ears and Nose: No external deformity. CV: RRR, No M/G/R. No JVD. No thrill. No extra heart sounds. PULM: CTA B, no wheezes, crackles, rhonchi. No retractions. No resp. distress. No accessory muscle use. ABD: S, NT, ND, +BS. No rebound. No HSM. EXTR: No c/c/e NEURO Normal gait.  PSYCH: Normally interactive. Conversant. Not depressed or anxious appearing.  Calm demeanor.    Assessment and Plan: Gastroenteritis zofran Imodium   Signed,  Phillips Odor, MD  Results for orders placed in visit on 01/11/14  POCT CBC      Result Value Ref Range   WBC 6.7  4.6 - 10.2 K/uL   Lymph, poc 1.4  0.6 - 3.4   POC LYMPH PERCENT 21.2  10 - 50 %L   MID (cbc) 0.6  0 - 0.9   POC MID % 8.3  0 - 12 %M   POC Granulocyte 4.7  2 - 6.9   Granulocyte percent 70.5  37 - 80 %G   RBC 4.51  4.04 - 5.48 M/uL   Hemoglobin 12.9  12.2 - 16.2 g/dL   HCT, POC 16.141.9  09.637.7 - 47.9 %   MCV 92.9  80 - 97 fL   MCH, POC 28.6  27 - 31.2 pg   MCHC 30.8 (*) 31.8 - 35.4 g/dL   RDW, POC 04.515.9     Platelet Count, POC 154  142 - 424 K/uL   MPV 10.2  0 - 99.8 fL   [facility] reading (PRIMARY) by  Dr. Wylene SimmerAnderson  negtive.

## 2014-01-11 NOTE — Addendum Note (Signed)
Addended by: Nita SellsSMITH, Jameila Keeny S on: 01/11/2014 04:12 PM   Modules accepted: Orders

## 2014-01-13 ENCOUNTER — Other Ambulatory Visit: Payer: Self-pay | Admitting: Emergency Medicine

## 2014-01-13 ENCOUNTER — Telehealth: Payer: Self-pay | Admitting: *Deleted

## 2014-01-13 DIAGNOSIS — A0472 Enterocolitis due to Clostridium difficile, not specified as recurrent: Secondary | ICD-10-CM

## 2014-01-13 LAB — CLOSTRIDIUM DIFFICILE EIA: CDIFTX: POSITIVE

## 2014-01-13 MED ORDER — VANCOMYCIN HCL 125 MG PO CAPS: 125.0000 mg | ORAL_CAPSULE | Freq: Four times a day (QID) | ORAL | Status: DC

## 2014-01-13 NOTE — Telephone Encounter (Signed)
Left a phone message for her to call me.  She should be treated with flagyl, but is allergic.

## 2014-01-13 NOTE — Telephone Encounter (Signed)
Will place patient on po vancomycin 125 mg po qid for 10 days secondary to her allergy. I have sent this into her [facility]. Please notify patient of the lab results of positive C diff.

## 2014-01-13 NOTE — Telephone Encounter (Signed)
Laura DikeJennifer from [facility] called to report a positive C-Dif Toxin for this patient. Please advise.

## 2014-01-14 ENCOUNTER — Telehealth: Payer: Self-pay

## 2014-01-14 NOTE — Telephone Encounter (Signed)
Patient was seen by Laura [name] and daughter is calling on behalf of mother because she's at work today. Patient received missed call to call lab tech back for lab results and that Laura [name] called in more medication for her but patient says it is over $100. Please advise. Patient at work today and so daughter says she can take the call for her mother to relay the information of lab results and to why more medication was called in. Thank you!    Darolyn RuaBrianna Serres - daughter on hippa [phone]

## 2014-01-14 NOTE — Telephone Encounter (Signed)
Please contact patient regarding her lab test and prescription.  I will contact GI or ID Monday for a referral.

## 2014-01-15 ENCOUNTER — Other Ambulatory Visit: Payer: Self-pay | Admitting: Emergency Medicine

## 2014-01-15 DIAGNOSIS — R109 Unspecified abdominal pain: Secondary | ICD-10-CM

## 2014-01-15 NOTE — Telephone Encounter (Signed)
Spoke with patient, she can't afford the Rx sent to pharmacy ($100).  Patient states it was [id number] when she took Flagyl and would try again if needs to.    She denies any diarrhea, able to keep some food down, but is still weak and fatigue.

## 2014-01-15 NOTE — Telephone Encounter (Signed)
I have been unable to reach the patient to find out how she is doing.  Is she having diarrhea or abdominal pain??  That is the question that requires an answer PRIOR to deciding on an antibiotic.

## 2014-01-15 NOTE — Telephone Encounter (Signed)
Pt can not afford the Vancomycin- it is $100. She states that she had a minor rash with Flagyl. Can she take this and try it since she can not afford it?

## 2014-01-15 NOTE — Telephone Encounter (Signed)
Message answered to under duplicate message- Rash with Flagyl asking if she can try that instead of the Vancomycin.

## 2014-01-16 NOTE — Telephone Encounter (Signed)
[name] No one is asking you to prescribe anything.  I spoke with the patient last night and informed her I am referring her to GI as she is not experiencing diarrhea or fever.  She is likely an asymptomatic carrier and not "infected".

## 2014-01-16 NOTE — Telephone Encounter (Signed)
Patient has been referred to see GI. I have contacted referrals to get an update on this. Patient was contacted at 9:06 this AM to schedule the appointment. They left her a voicemail to call back. We have requested to have this appointment made with the first available provider. I have also requested for the treating MD to be notified of her appointment date. I have offered second line treatment with vancomycin secondary to her allergy to Flagyl, which the patient declined. I am not comfortable prescribing medication that she is allergic to. I will leave any and all further treatment decisions to the treating MD until she sees the GI specialists for evaluation and treatment of her C difficile enteritis.

## 2014-01-16 NOTE — Telephone Encounter (Signed)
I am not comfortable prescribing medication that patient has an allergy to. treating MD will make final decision on this. Will forward to treating MD to decide on any further evaluation and treatment.   On a side note: Patient can hold her Vyvanse prescription to pick up her vancomycin.

## 2014-01-18 DIAGNOSIS — Z0271 Encounter for disability determination: Secondary | ICD-10-CM

## 2014-02-12 DIAGNOSIS — Z0271 Encounter for disability determination: Secondary | ICD-10-CM

## 2014-02-16 ENCOUNTER — Encounter: Payer: Self-pay | Admitting: Emergency Medicine

## 2014-03-28 ENCOUNTER — Telehealth (HOSPITAL_COMMUNITY): Payer: Self-pay | Admitting: *Deleted

## 2014-03-28 DIAGNOSIS — F988 Other specified behavioral and emotional disorders with onset usually occurring in childhood and adolescence: Secondary | ICD-10-CM

## 2014-03-28 MED ORDER — LISDEXAMFETAMINE DIMESYLATE 70 MG PO CAPS: 70.0000 mg | ORAL_CAPSULE | ORAL | Status: DC

## 2014-03-28 NOTE — Telephone Encounter (Signed)
Rx filled

## 2014-03-29 ENCOUNTER — Telehealth (HOSPITAL_COMMUNITY): Payer: Self-pay

## 2014-03-29 NOTE — Telephone Encounter (Signed)
9:39am 03/29/14 Patietn came and pick-up rx script had no DL - reminded pt to have it next time.Marland Kitchen.[name]/sh

## 2014-04-13 ENCOUNTER — Ambulatory Visit (INDEPENDENT_AMBULATORY_CARE_PROVIDER_SITE_OTHER): Payer: 59 | Admitting: Psychiatry

## 2014-04-13 DIAGNOSIS — F988 Other specified behavioral and emotional disorders with onset usually occurring in childhood and adolescence: Secondary | ICD-10-CM

## 2014-04-13 DIAGNOSIS — F909 Attention-deficit hyperactivity disorder, unspecified type: Secondary | ICD-10-CM

## 2014-04-13 MED ORDER — LISDEXAMFETAMINE DIMESYLATE 70 MG PO CAPS: 70.0000 mg | ORAL_CAPSULE | ORAL | Status: DC

## 2014-04-13 MED ORDER — LISDEXAMFETAMINE DIMESYLATE 70 MG PO CAPS: ORAL_CAPSULE | ORAL | Status: DC
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
## 2014-08-08 ENCOUNTER — Other Ambulatory Visit (HOSPITAL_COMMUNITY): Payer: Self-pay | Admitting: *Deleted

## 2014-08-08 ENCOUNTER — Telehealth (HOSPITAL_COMMUNITY): Payer: Self-pay

## 2014-08-08 DIAGNOSIS — F988 Other specified behavioral and emotional disorders with onset usually occurring in childhood and adolescence: Secondary | ICD-10-CM

## 2014-08-08 MED ORDER — LISDEXAMFETAMINE DIMESYLATE 70 MG PO CAPS: 70.0000 mg | ORAL_CAPSULE | ORAL | Status: DC

## 2014-08-08 NOTE — Telephone Encounter (Signed)
4:49pm 08/08/14 Patient came and pick-up rx script didn't have her DL#.Marland Kitchen.[name]/sh

## 2014-08-17 ENCOUNTER — Ambulatory Visit (INDEPENDENT_AMBULATORY_CARE_PROVIDER_SITE_OTHER): Payer: 59 | Admitting: Psychiatry

## 2014-08-17 VITALS — BP 105/57 | HR 60 | Wt 139.6 lb

## 2014-08-17 DIAGNOSIS — F988 Other specified behavioral and emotional disorders with onset usually occurring in childhood and adolescence: Secondary | ICD-10-CM

## 2014-08-17 DIAGNOSIS — F901 Attention-deficit hyperactivity disorder, predominantly hyperactive type: Secondary | ICD-10-CM

## 2014-08-17 MED ORDER — LISDEXAMFETAMINE DIMESYLATE 70 MG PO CAPS: ORAL_CAPSULE | ORAL | Status: DC

## 2014-08-17 MED ORDER — LISDEXAMFETAMINE DIMESYLATE 70 MG PO CAPS
70.0000 mg | ORAL_CAPSULE | ORAL | Status: DC
Start: 2014-08-17 — End: 2014-08-17

## 2014-08-17 NOTE — Progress Notes (Signed)
[facility]  08/17/2014 10:44 AM Laura BolkLisa M [name]  MRN:  [id number] Subjective: Doing great Today the patient is full of energy. She claims the Vyvanse is very helpful. It makes her more effective in her busy work place. She works for [employer]. The patient is very active at work plays a big role and is doing well. She is able to focus organize herself and is better at completing tasks. The patient's mood is good. She is sleeping and eating well. She denies the use of drugs or alcohol. At this time she is getting along better with her daughter and granddaughter. She lives with her son and he is supportive. The patient was involved with a man for a little while but apparently he turned and became very angry and irritable. That relationship ended. At this time the patient is to looking for relationship. Nonetheless the Vyvanse seemed to be very helpful. And she'll continue Diagnosis:   DSM5: Schizophrenia Disorders:   Obsessive-Compulsive Disorders:   Trauma-Stressor Disorders:   Substance/Addictive Disorders:   Depressive Disorders:   Total Time spent with patient:   Axis I: ADHD, hyperactive type  ADL's:  Intact  Sleep: Good  Appetite:  Good  Suicidal Ideation:  no Homicidal Ideation:  none AEB (as evidenced by):  Psychiatric Specialty Exam: Physical Exam  ROS  Blood pressure 105/57, pulse 60, weight 139 lb 9.6 oz (63.322 kg).Body mass index is 23.23 kg/(m^2).  General Appearance: Fairly Groomed  Patent attorneyye Contact::  Good  Speech:  Clear and Coherent  Volume:  Normal  Mood:  Euthymic  Affect:  Appropriate  Thought Process:  Coherent  Orientation:  Full (Time, Place, and Person)  Thought Content:  WDL  Suicidal Thoughts:  No  Homicidal Thoughts:  No  Memory:  NA  Judgement:  Good  Insight:  Good  Psychomotor Activity:  Normal  Concentration:  Good  Recall:  Good  Fund of Knowledge:Good  Language: Good  Akathisia:  No  Handed:  Right  AIMS (if indicated):     Assets:   Communication Skills  Sleep:      Musculoskeletal: Strength & Muscle Tone:  Gait & Station:  Patient leans:   Current Medications: Current Outpatient Prescriptions  Medication Sig Dispense Refill  . lisdexamfetamine (VYVANSE) 70 MG capsule Refill after  10/09/2014  30 capsule  0  . loperamide (IMODIUM A-D) 2 MG tablet 2 now and one hourly prn diarrhea.  Max 8 tabs in 24 hours  30 tablet  0  . ondansetron (ZOFRAN-ODT) 8 MG disintegrating tablet Take 1 tablet (8 mg total) by mouth every 8 (eight) hours as needed for nausea.  30 tablet  0  . traMADol (ULTRAM) 50 MG tablet Take 1 tablet (50 mg total) by mouth every 6 (six) hours as needed.  15 tablet  0  . vancomycin (VANCOCIN) 125 MG capsule Take 1 capsule (125 mg total) by mouth 4 (four) times daily.  40 capsule  0   No current facility-administered medications for this visit.    Lab Results: No results found for this or any previous visit (from the past 48 hour(s)).  Physical Findings: AIMS:  , ,  ,  ,    CIWA:    COWS:     Treatment Plan Summary: At this time the patient will continue taking Vyvanse 70 mg a day. This patient to be reevaluated in 5 months. Today reviewed the benefits of the medications. The patient denies any chest pain shortness of breath or  any new physical problems.  Plan:  Medical Decision Making Problem Points:  Established problem, stable/improving (1) Data Points:  Review of medication regiment & side effects (2)  I certify that inpatient services furnished can reasonably be expected to improve the patient's condition.   Laura [name] 08/17/2014, 10:44 AM

## 2014-09-26 ENCOUNTER — Other Ambulatory Visit: Payer: Self-pay | Admitting: Obstetrics and Gynecology

## 2014-09-26 DIAGNOSIS — R928 Other abnormal and inconclusive findings on diagnostic imaging of breast: Secondary | ICD-10-CM

## 2014-10-12 ENCOUNTER — Other Ambulatory Visit: Payer: Self-pay

## 2014-10-19 ENCOUNTER — Ambulatory Visit
Admission: RE | Admit: 2014-10-19 | Discharge: 2014-10-19 | Disposition: A | Discharge status: Home / Self Care | Payer: 59 | Source: Ambulatory Visit | Attending: Obstetrics and Gynecology | Admitting: Obstetrics and Gynecology

## 2014-10-19 DIAGNOSIS — R928 Other abnormal and inconclusive findings on diagnostic imaging of breast: Secondary | ICD-10-CM

## 2014-12-25 ENCOUNTER — Other Ambulatory Visit (HOSPITAL_COMMUNITY): Payer: Self-pay

## 2014-12-25 NOTE — Telephone Encounter (Signed)
Received a call message from patient with request for Vyvanse refill.  Patient next appointment 01/18/15.  No answer with return call and last order was written 08/17/14.

## 2014-12-27 NOTE — Telephone Encounter (Signed)
I called patient back.  I advised patient that the message was sent to Dr. Donell BeersPlovsky and he will be here tomorrow morning so we can check again with him.  I advised patient we did make a call back to her on 11-24-14, no answer.  I advised patient I will pass message on again to Dr. Donell BeersPlovsky.  Patient verbalized understanding.  Patient requesting refill on Vyvanse. Patient last seen 08/17/14 and has follow up scheduled 01-18-15.  Please advise  Thank you

## 2014-12-31 ENCOUNTER — Other Ambulatory Visit (HOSPITAL_COMMUNITY): Payer: Self-pay | Admitting: Psychiatry

## 2014-12-31 DIAGNOSIS — F988 Other specified behavioral and emotional disorders with onset usually occurring in childhood and adolescence: Secondary | ICD-10-CM

## 2014-12-31 MED ORDER — LISDEXAMFETAMINE DIMESYLATE 70 MG PO CAPS: ORAL_CAPSULE | ORAL | Status: DC

## 2014-12-31 NOTE — Telephone Encounter (Signed)
Patient called this morning upset she has been unable to get her requested Vyvanse filled.  States she has been trying to get a refill for over a week but Dr. Donell BeersPlovsky has been unavailable to write prescription.  Patient's next evaluation set for 01/18/15 and would like a prescription to be written to last until then.  Patient states plan to pick up once approved and written.  Requests call back at [phone] once order is complete and approved. Denies any current problems but admits frustration trying to get refill.  Patient agreed in the future to begin approximately a week prior to running out of medication to request refill as Dr. Jeannett SeniorPlovskly in this office practice only 2 days per week.

## 2014-12-31 NOTE — Telephone Encounter (Signed)
Refill for vyvanse 70 mg done for 30 days

## 2014-12-31 NOTE — Telephone Encounter (Signed)
vyvanse done

## 2014-12-31 NOTE — Telephone Encounter (Signed)
Left patient a message as she requested to inform her new Vyvanse prescription was ready for pick up.

## 2015-01-01 ENCOUNTER — Telehealth (HOSPITAL_COMMUNITY): Payer: Self-pay

## 2015-01-01 NOTE — Telephone Encounter (Signed)
01/01/15 Patient came and pick-up rx script [id number].Marland Kitchen.[name]/sh

## 2015-01-18 ENCOUNTER — Encounter (HOSPITAL_COMMUNITY): Payer: Self-pay | Admitting: Psychiatry

## 2015-01-18 ENCOUNTER — Ambulatory Visit (INDEPENDENT_AMBULATORY_CARE_PROVIDER_SITE_OTHER): Payer: Self-pay | Admitting: Psychiatry

## 2015-01-18 VITALS — BP 100/62 | HR 61 | Ht 65.0 in | Wt 151.4 lb

## 2015-01-18 DIAGNOSIS — F988 Other specified behavioral and emotional disorders with onset usually occurring in childhood and adolescence: Secondary | ICD-10-CM

## 2015-01-18 DIAGNOSIS — F909 Attention-deficit hyperactivity disorder, unspecified type: Secondary | ICD-10-CM

## 2015-01-18 MED ORDER — LISDEXAMFETAMINE DIMESYLATE 70 MG PO CAPS: ORAL_CAPSULE | ORAL | Status: DC

## 2015-01-18 NOTE — Progress Notes (Addendum)
[facility] MD Progress Note  01/18/2015 11:44 AM Laura [name]  MRN:  [id number] Subjective:  Doing great At this time her family is less stressful. Work is going well. The patient clearly gets a benefit from Vyvanse. She'd run out of it by air for 2 weeks and she could feel a distinct problem. Vyvanse helps her be organized focused and helps her complete tasks. Emotionally he has a benefit as well as she says when she is on it she feels emotionally not as well. The patient does point out that she notices that when she was off the Vyvanse for a few weeks her complexion cleared up. I shared with her that I was unfamiliar about Vyvanse affecting your complexion. The patient presently is seeing a dermatologist but hasn't told them about the relationship with Vyvanse yet. Nonetheless the patient and I both 1 to continue the Vyvanse and she's having a clearly a positive benefit from it works [name] more effective and feels better on it. The patient does not drink any alcohol or use any drugs. Principal Problem: Attention Deficit Disorder Diagnosis:   Patient Active Problem List   Diagnosis Date Noted  . Attention deficit disorder without mention of hyperactivity [F90.9] 09/01/2013  . Attention deficit disorder with hyperactivity(314.01) [F90.9] 03/10/2013  . ADD (attention deficit disorder) [F90.9] 01/23/2013   Total Time spent with patient: 30 minutes   Past Medical History:  Past Medical History  Diagnosis Date  . Allergy   . Anxiety   . Depression     Past Surgical History  Procedure Laterality Date  . Bunionectomy  2004    R foot   Family History:  Family History  Problem Relation Age of Onset  . Stroke Mother   . Scoliosis Mother   . Gallbladder disease Mother   . Anxiety disorder Mother   . Depression Mother   . Heart disease Maternal Grandmother   . Stroke Maternal Grandmother   . Cancer Paternal Grandmother     breast  . Heart disease Paternal Grandfather   . GER disease Daughter    . Gallbladder disease Daughter    Social History:  History  Alcohol Use No     History  Drug Use No    History   Social History  . Marital Status: Single    Spouse Name: N/A  . Number of Children: N/A  . Years of Education: N/A   Social History Main Topics  . Smoking status: Never Smoker   . Smokeless tobacco: Not on file  . Alcohol Use: No  . Drug Use: No  . Sexual Activity: No   Other Topics Concern  . None   Social History Narrative   Additional History:    Sleep: Good  Appetite:  Good   Assessment:   Musculoskeletal: Strength & Muscle Tone:  Gait & Station:  Patient leans:    Psychiatric Specialty Exam: Physical Exam  ROS  Blood pressure 100/62, pulse 61, height  (1.651 m), weight 151 lb 6.4 oz (68.675 kg), last menstrual period 01/06/2015.Body mass index is 25.19 kg/(m^2).  General Appearance: Casual  Eye Contact::  Good  Speech:  Clear and Coherent  Volume:  Normal  Mood:  Euthymic  Affect:  NA  Thought Process:  Coherent  Orientation:  Full (Time, Place, and Person)  Thought Content:  WDL  Suicidal Thoughts:  No  Homicidal Thoughts:  No  Memory:  NA  Judgement:  Good  Insight:  Good  Psychomotor Activity:  Normal  Concentration:  Good  Recall:  Good  Fund of Knowledge:Good  Language: Good  Akathisia:  No  Handed:  Right  AIMS (if indicated):     Assets:  Desire for Improvement  ADL's:  Intact  Cognition: WNL  Sleep:        Current Medications: Current Outpatient Prescriptions  Medication Sig Dispense Refill  . lisdexamfetamine (VYVANSE) 70 MG capsule Fill after 3/5 /2016 30 capsule 0  . vancomycin (VANCOCIN) 125 MG capsule Take 1 capsule (125 mg total) by mouth 4 (four) times daily. 40 capsule 0  . loperamide (IMODIUM A-D) 2 MG tablet 2 now and one hourly prn diarrhea.  Max 8 tabs in 24 hours (Patient not taking: Reported on 01/18/2015) 30 tablet 0  . ondansetron (ZOFRAN-ODT) 8 MG disintegrating tablet Take 1 tablet (8 mg  total) by mouth every 8 (eight) hours as needed for nausea. (Patient not taking: Reported on 01/18/2015) 30 tablet 0  . traMADol (ULTRAM) 50 MG tablet Take 1 tablet (50 mg total) by mouth every 6 (six) hours as needed. (Patient not taking: Reported on 01/18/2015) 15 tablet 0   No current facility-administered medications for this visit.    Lab Results: No results found for this or any previous visit (from the past 48 hour(s)).  Physical Findings: AIMS:  , ,  ,  ,    CIWA:    COWS:     Treatment Plan Summary: At this time the patient will continue taking Vyvanse 70 mg. Today the patient denies any physical complaints.   Medical Decision Making:  Review of Medication Regimen & Side Effects (2)     Laura [name] 01/18/2015, 11:44 AM

## 2015-01-30 ENCOUNTER — Other Ambulatory Visit: Payer: Self-pay | Admitting: Orthopedic Surgery

## 2015-01-30 DIAGNOSIS — M545 Low back pain: Secondary | ICD-10-CM

## 2015-02-15 ENCOUNTER — Ambulatory Visit
Admission: RE | Admit: 2015-02-15 | Discharge: 2015-02-15 | Disposition: A | Discharge status: Home / Self Care | Payer: 59 | Source: Ambulatory Visit | Attending: Orthopedic Surgery | Admitting: Orthopedic Surgery

## 2015-02-15 DIAGNOSIS — M545 Low back pain: Secondary | ICD-10-CM

## 2015-04-12 ENCOUNTER — Ambulatory Visit (HOSPITAL_COMMUNITY): Payer: Self-pay | Admitting: Psychiatry

## 2016-12-31 ENCOUNTER — Emergency Department (HOSPITAL_COMMUNITY): Payer: 59

## 2016-12-31 ENCOUNTER — Encounter (HOSPITAL_COMMUNITY): Payer: Self-pay | Admitting: Emergency Medicine

## 2016-12-31 ENCOUNTER — Encounter (HOSPITAL_COMMUNITY)
Admission: EM | Disposition: A | Discharge status: Home / Self Care | Payer: Self-pay | Source: Home / Self Care | Attending: Emergency Medicine

## 2016-12-31 ENCOUNTER — Emergency Department (HOSPITAL_COMMUNITY): Payer: 59 | Admitting: Anesthesiology

## 2016-12-31 ENCOUNTER — Observation Stay (HOSPITAL_COMMUNITY)
Admission: EM | Admit: 2016-12-31 | Discharge: 2017-01-02 | Disposition: A | Discharge status: Home / Self Care | Payer: 59 | Attending: Otolaryngology | Admitting: Otolaryngology

## 2016-12-31 DIAGNOSIS — S01311A Laceration without foreign body of right ear, initial encounter: Secondary | ICD-10-CM | POA: Diagnosis present

## 2016-12-31 DIAGNOSIS — W540XXA Bitten by dog, initial encounter: Secondary | ICD-10-CM | POA: Diagnosis present

## 2016-12-31 DIAGNOSIS — S52552A Other extraarticular fracture of lower end of left radius, initial encounter for closed fracture: Secondary | ICD-10-CM

## 2016-12-31 DIAGNOSIS — S01431A Puncture wound without foreign body of right cheek and temporomandibular area, initial encounter: Secondary | ICD-10-CM | POA: Insufficient documentation

## 2016-12-31 DIAGNOSIS — S0125XA Open bite of nose, initial encounter: Secondary | ICD-10-CM | POA: Diagnosis not present

## 2016-12-31 DIAGNOSIS — S0185XA Open bite of other part of head, initial encounter: Secondary | ICD-10-CM | POA: Diagnosis not present

## 2016-12-31 DIAGNOSIS — S08121A Partial traumatic amputation of right ear, initial encounter: Secondary | ICD-10-CM | POA: Diagnosis not present

## 2016-12-31 DIAGNOSIS — F329 Major depressive disorder, single episode, unspecified: Secondary | ICD-10-CM | POA: Diagnosis not present

## 2016-12-31 DIAGNOSIS — S01151A Open bite of right eyelid and periocular area, initial encounter: Secondary | ICD-10-CM | POA: Insufficient documentation

## 2016-12-31 DIAGNOSIS — F419 Anxiety disorder, unspecified: Secondary | ICD-10-CM | POA: Diagnosis not present

## 2016-12-31 DIAGNOSIS — S0101XA Laceration without foreign body of scalp, initial encounter: Secondary | ICD-10-CM | POA: Insufficient documentation

## 2016-12-31 DIAGNOSIS — S61552A Open bite of left wrist, initial encounter: Secondary | ICD-10-CM | POA: Diagnosis present

## 2016-12-31 DIAGNOSIS — S52502A Unspecified fracture of the lower end of left radius, initial encounter for closed fracture: Principal | ICD-10-CM

## 2016-12-31 DIAGNOSIS — D62 Acute posthemorrhagic anemia: Secondary | ICD-10-CM | POA: Insufficient documentation

## 2016-12-31 HISTORY — PX: LACERATION REPAIR: SHX5284

## 2016-12-31 HISTORY — PX: INCISION AND DRAINAGE OF WOUND: SHX1803

## 2016-12-31 LAB — URINALYSIS, ROUTINE W REFLEX MICROSCOPIC
Bacteria, UA: NONE SEEN
Bilirubin Urine: NEGATIVE
GLUCOSE, UA: NEGATIVE mg/dL
KETONES UR: 5 mg/dL — AB
LEUKOCYTES UA: NEGATIVE
Nitrite: NEGATIVE
PH: 5 (ref 5.0–8.0)
Protein, ur: NEGATIVE mg/dL
SPECIFIC GRAVITY, URINE: 1.019 (ref 1.005–1.030)

## 2016-12-31 LAB — PROTIME-INR
INR: 0.99
PROTHROMBIN TIME: 13.1 s (ref 11.4–15.2)

## 2016-12-31 LAB — CBC
HCT: 34.9 % — ABNORMAL LOW (ref 36.0–46.0)
HEMOGLOBIN: 10.8 g/dL — AB (ref 12.0–15.0)
MCH: 28.3 pg (ref 26.0–34.0)
MCHC: 30.9 g/dL (ref 30.0–36.0)
MCV: 91.4 fL (ref 78.0–100.0)
PLATELETS: 296 10*3/uL (ref 150–400)
RBC: 3.82 MIL/uL — AB (ref 3.87–5.11)
RDW: 14.1 % (ref 11.5–15.5)
WBC: 11.2 10*3/uL — AB (ref 4.0–10.5)

## 2016-12-31 LAB — COMPREHENSIVE METABOLIC PANEL
ALT: 14 U/L (ref 14–54)
ANION GAP: 7 (ref 5–15)
AST: 20 U/L (ref 15–41)
Albumin: 3.4 g/dL — ABNORMAL LOW (ref 3.5–5.0)
Alkaline Phosphatase: 31 U/L — ABNORMAL LOW (ref 38–126)
BILIRUBIN TOTAL: 0.5 mg/dL (ref 0.3–1.2)
BUN: 18 mg/dL (ref 6–20)
CO2: 23 mmol/L (ref 22–32)
CREATININE: 1.06 mg/dL — AB (ref 0.44–1.00)
Calcium: 8.7 mg/dL — ABNORMAL LOW (ref 8.9–10.3)
Chloride: 108 mmol/L (ref 101–111)
GFR, EST NON AFRICAN AMERICAN: 60 mL/min — AB (ref >60)
Glucose, Bld: 108 mg/dL — ABNORMAL HIGH (ref 65–99)
Potassium: 3.7 mmol/L (ref 3.5–5.1)
SODIUM: 138 mmol/L (ref 135–145)
TOTAL PROTEIN: 6 g/dL — AB (ref 6.5–8.1)

## 2016-12-31 LAB — PREPARE FRESH FROZEN PLASMA
BLOOD PRODUCT EXPIRATION DATE: 201803082359
BLOOD PRODUCT EXPIRATION DATE: 201803102359
ISSUE DATE / TIME: 201802220813
ISSUE DATE / TIME: 201802220813
UNIT TYPE AND RH: 600
Unit Type and Rh: 6200

## 2016-12-31 LAB — TYPE AND SCREEN
BLOOD PRODUCT EXPIRATION DATE: 201802272359
Blood Product Expiration Date: 201803152359
ISSUE DATE / TIME: 201802220812
ISSUE DATE / TIME: 201802220812
UNIT TYPE AND RH: 9500
Unit Type and Rh: 9500

## 2016-12-31 LAB — CDS SEROLOGY

## 2016-12-31 LAB — I-STAT CHEM 8, ED
BUN: 22 mg/dL — AB (ref 6–20)
CALCIUM ION: 1.11 mmol/L — AB (ref 1.15–1.40)
CHLORIDE: 108 mmol/L (ref 101–111)
CREATININE: 1.1 mg/dL — AB (ref 0.44–1.00)
GLUCOSE: 106 mg/dL — AB (ref 65–99)
HCT: 35 % — ABNORMAL LOW (ref 36.0–46.0)
Hemoglobin: 11.9 g/dL — ABNORMAL LOW (ref 12.0–15.0)
Potassium: 3.7 mmol/L (ref 3.5–5.1)
Sodium: 142 mmol/L (ref 135–145)
TCO2: 23 mmol/L (ref 0–100)

## 2016-12-31 LAB — I-STAT CG4 LACTIC ACID, ED: Lactic Acid, Venous: 1.13 mmol/L (ref 0.5–1.9)

## 2016-12-31 LAB — ETHANOL

## 2016-12-31 LAB — ABO/RH: ABO/RH(D): O POS

## 2016-12-31 SURGERY — REPAIR, LACERATION, 2 OR MORE
Anesthesia: General | Site: Face

## 2016-12-31 MED ORDER — OXYCODONE HCL 5 MG PO TABS
5.0000 mg | ORAL_TABLET | ORAL | Status: DC | PRN
  Administered 2016-12-31 (×2): 10 mg via ORAL
  Filled 2016-12-31 (×2): qty 2

## 2016-12-31 MED ORDER — SODIUM CHLORIDE 0.9 % IV BOLUS (SEPSIS)
1000.0000 mL | Freq: Once | INTRAVENOUS | Status: AC
  Administered 2016-12-31: 1000 mL via INTRAVENOUS

## 2016-12-31 MED ORDER — METOCLOPRAMIDE HCL 5 MG/ML IJ SOLN
INTRAMUSCULAR | Status: AC
  Filled 2016-12-31: qty 2

## 2016-12-31 MED ORDER — LIDOCAINE 2% (20 MG/ML) 5 ML SYRINGE
INTRAMUSCULAR | Status: AC
  Filled 2016-12-31: qty 5

## 2016-12-31 MED ORDER — PIPERACILLIN-TAZOBACTAM 3.375 G IVPB 30 MIN
3.3750 g | INTRAVENOUS | Status: AC
  Administered 2016-12-31: 3.375 g via INTRAVENOUS
  Filled 2016-12-31: qty 50

## 2016-12-31 MED ORDER — ONDANSETRON HCL 4 MG PO TABS
4.0000 mg | ORAL_TABLET | Freq: Four times a day (QID) | ORAL | Status: DC | PRN
  Filled 2016-12-31: qty 1

## 2016-12-31 MED ORDER — OXYCODONE HCL 5 MG PO TABS
ORAL_TABLET | ORAL | Status: AC
  Administered 2016-12-31: 10 mg via ORAL
  Filled 2016-12-31: qty 2

## 2016-12-31 MED ORDER — LACTATED RINGERS IV SOLN
INTRAVENOUS | Status: DC | PRN
  Administered 2016-12-31 (×2): via INTRAVENOUS

## 2016-12-31 MED ORDER — FENTANYL CITRATE (PF) 100 MCG/2ML IJ SOLN
INTRAMUSCULAR | Status: AC
  Administered 2016-12-31: 50 ug via INTRAVENOUS
  Filled 2016-12-31: qty 2

## 2016-12-31 MED ORDER — SUCCINYLCHOLINE CHLORIDE 200 MG/10ML IV SOSY
PREFILLED_SYRINGE | INTRAVENOUS | Status: AC
  Filled 2016-12-31: qty 20

## 2016-12-31 MED ORDER — FENTANYL CITRATE (PF) 100 MCG/2ML IJ SOLN
INTRAMUSCULAR | Status: DC | PRN
  Administered 2016-12-31 (×5): 50 ug via INTRAVENOUS

## 2016-12-31 MED ORDER — KCL IN DEXTROSE-NACL 20-5-0.45 MEQ/L-%-% IV SOLN: INTRAVENOUS | Status: DC

## 2016-12-31 MED ORDER — LIDOCAINE HCL (CARDIAC) 20 MG/ML IV SOLN: INTRAVENOUS | Status: DC | PRN

## 2016-12-31 MED ORDER — SUCCINYLCHOLINE CHLORIDE 20 MG/ML IJ SOLN
INTRAMUSCULAR | Status: DC | PRN
  Administered 2016-12-31: 100 mg via INTRAVENOUS

## 2016-12-31 MED ORDER — DEXAMETHASONE SODIUM PHOSPHATE 10 MG/ML IJ SOLN
INTRAMUSCULAR | Status: DC | PRN
  Administered 2016-12-31: 5 mg via INTRAVENOUS

## 2016-12-31 MED ORDER — FENTANYL CITRATE (PF) 100 MCG/2ML IJ SOLN
INTRAMUSCULAR | Status: AC
  Filled 2016-12-31: qty 2

## 2016-12-31 MED ORDER — ONDANSETRON HCL 4 MG/2ML IJ SOLN
4.0000 mg | Freq: Four times a day (QID) | INTRAMUSCULAR | Status: DC | PRN
  Administered 2016-12-31 – 2017-01-01 (×2): 4 mg via INTRAVENOUS
  Filled 2016-12-31 (×3): qty 2

## 2016-12-31 MED ORDER — HYDROMORPHONE HCL 1 MG/ML IJ SOLN
0.5000 mg | INTRAMUSCULAR | Status: DC | PRN
Start: 2016-12-31 — End: 2017-01-01

## 2016-12-31 MED ORDER — MIDAZOLAM HCL 5 MG/5ML IJ SOLN
INTRAMUSCULAR | Status: DC | PRN
  Administered 2016-12-31 (×2): 1 mg via INTRAVENOUS

## 2016-12-31 MED ORDER — FENTANYL CITRATE (PF) 100 MCG/2ML IJ SOLN
25.0000 ug | INTRAMUSCULAR | Status: DC | PRN
Start: 2016-12-31 — End: 2016-12-31
  Administered 2016-12-31 (×3): 50 ug via INTRAVENOUS

## 2016-12-31 MED ORDER — PROPOFOL 10 MG/ML IV BOLUS
INTRAVENOUS | Status: AC
  Filled 2016-12-31: qty 20

## 2016-12-31 MED ORDER — FENTANYL CITRATE (PF) 100 MCG/2ML IJ SOLN
50.0000 ug | Freq: Once | INTRAMUSCULAR | Status: AC
  Administered 2016-12-31: 50 ug via INTRAVENOUS

## 2016-12-31 MED ORDER — HYDROCODONE-ACETAMINOPHEN 7.5-325 MG PO TABS: 1.0000 | ORAL_TABLET | Freq: Once | ORAL | Status: DC | PRN

## 2016-12-31 MED ORDER — DEXTROSE 5 % IV SOLN
INTRAVENOUS | Status: DC | PRN
  Administered 2016-12-31: 25 ug/min via INTRAVENOUS

## 2016-12-31 MED ORDER — SODIUM CHLORIDE 0.45 % IV SOLN
INTRAVENOUS | Status: DC
  Administered 2016-12-31 – 2017-01-01 (×2): via INTRAVENOUS

## 2016-12-31 MED ORDER — PROPOFOL 10 MG/ML IV BOLUS
INTRAVENOUS | Status: DC | PRN
  Administered 2016-12-31: 140 mg via INTRAVENOUS

## 2016-12-31 MED ORDER — ONDANSETRON HCL 4 MG/2ML IJ SOLN
INTRAMUSCULAR | Status: AC
  Filled 2016-12-31: qty 4

## 2016-12-31 MED ORDER — PANTOPRAZOLE SODIUM 40 MG IV SOLR
40.0000 mg | Freq: Every day | INTRAVENOUS | Status: DC
  Administered 2017-01-01: 40 mg via INTRAVENOUS
  Filled 2016-12-31 (×2): qty 40

## 2016-12-31 MED ORDER — ARTIFICIAL TEARS OP OINT
TOPICAL_OINTMENT | OPHTHALMIC | Status: AC
  Filled 2016-12-31: qty 3.5

## 2016-12-31 MED ORDER — BACITRACIN ZINC 500 UNIT/GM EX OINT
TOPICAL_OINTMENT | Freq: Two times a day (BID) | CUTANEOUS | Status: DC
  Administered 2016-12-31 – 2017-01-02 (×4): via TOPICAL
  Filled 2016-12-31: qty 28.35

## 2016-12-31 MED ORDER — MEPERIDINE HCL 25 MG/ML IJ SOLN: 6.2500 mg | INTRAMUSCULAR | Status: DC | PRN

## 2016-12-31 MED ORDER — ACETAMINOPHEN 325 MG PO TABS
650.0000 mg | ORAL_TABLET | ORAL | Status: DC | PRN
  Administered 2017-01-01 – 2017-01-02 (×3): 650 mg via ORAL
  Filled 2016-12-31 (×4): qty 2

## 2016-12-31 MED ORDER — GLYCOPYRROLATE 0.2 MG/ML IJ SOLN
INTRAMUSCULAR | Status: DC | PRN
  Administered 2016-12-31: 0.1 mg via INTRAVENOUS
  Administered 2016-12-31: 0.2 mg via INTRAVENOUS

## 2016-12-31 MED ORDER — 0.9 % SODIUM CHLORIDE (POUR BTL) OPTIME
TOPICAL | Status: DC | PRN
  Administered 2016-12-31 (×3): 1000 mL

## 2016-12-31 MED ORDER — EPHEDRINE SULFATE 50 MG/ML IJ SOLN
INTRAMUSCULAR | Status: DC | PRN
  Administered 2016-12-31: 5 mg via INTRAVENOUS

## 2016-12-31 MED ORDER — CEFAZOLIN SODIUM 1 G IJ SOLR
INTRAMUSCULAR | Status: DC | PRN
Start: 2016-12-31 — End: 2016-12-31
  Administered 2016-12-31: 2 g via INTRAMUSCULAR

## 2016-12-31 MED ORDER — BACITRACIN ZINC 500 UNIT/GM EX OINT
TOPICAL_OINTMENT | CUTANEOUS | Status: DC | PRN
  Administered 2016-12-31: 1 via TOPICAL

## 2016-12-31 MED ORDER — MIDAZOLAM HCL 2 MG/2ML IJ SOLN
INTRAMUSCULAR | Status: AC
  Filled 2016-12-31: qty 2

## 2016-12-31 MED ORDER — CEFAZOLIN SODIUM-DEXTROSE 2-4 GM/100ML-% IV SOLN
2.0000 g | Freq: Once | INTRAVENOUS | Status: AC
  Administered 2016-12-31: 2 g via INTRAVENOUS

## 2016-12-31 MED ORDER — PANTOPRAZOLE SODIUM 40 MG PO TBEC
40.0000 mg | DELAYED_RELEASE_TABLET | Freq: Every day | ORAL | Status: DC
  Administered 2017-01-02: 40 mg via ORAL
  Filled 2016-12-31 (×2): qty 1

## 2016-12-31 MED ORDER — DEXAMETHASONE SODIUM PHOSPHATE 10 MG/ML IJ SOLN
INTRAMUSCULAR | Status: AC
  Filled 2016-12-31: qty 2

## 2016-12-31 MED ORDER — ONDANSETRON HCL 4 MG/2ML IJ SOLN
INTRAMUSCULAR | Status: DC | PRN
  Administered 2016-12-31: 4 mg via INTRAVENOUS

## 2016-12-31 MED ORDER — TETANUS-DIPHTH-ACELL PERTUSSIS 5-2.5-18.5 LF-MCG/0.5 IM SUSP
0.5000 mL | Freq: Once | INTRAMUSCULAR | Status: AC
  Administered 2016-12-31: 0.5 mL via INTRAMUSCULAR

## 2016-12-31 MED ORDER — ROCURONIUM BROMIDE 100 MG/10ML IV SOLN
INTRAVENOUS | Status: DC | PRN
Start: 2016-12-31 — End: 2016-12-31
  Administered 2016-12-31: 10 mg via INTRAVENOUS

## 2016-12-31 MED ORDER — METOCLOPRAMIDE HCL 5 MG/ML IJ SOLN
10.0000 mg | Freq: Once | INTRAMUSCULAR | Status: AC | PRN
  Administered 2016-12-31: 10 mg via INTRAVENOUS

## 2016-12-31 SURGICAL SUPPLY — 66 items
BANDAGE ACE 4X5 VEL STRL LF (GAUZE/BANDAGES/DRESSINGS) ×2 IMPLANT
BLADE SURG 15 STRL LF DISP TIS (BLADE) IMPLANT
BLADE SURG 15 STRL SS (BLADE) ×8
CANISTER SUCTION 2500CC (MISCELLANEOUS) ×2 IMPLANT
CLEANER TIP ELECTROSURG 2X2 (MISCELLANEOUS) ×2 IMPLANT
CONT SPEC 4OZ CLIKSEAL STRL BL (MISCELLANEOUS) ×4 IMPLANT
CORDS BIPOLAR (ELECTRODE) ×2 IMPLANT
COVER SURGICAL LIGHT HANDLE (MISCELLANEOUS) ×2 IMPLANT
CRADLE DONUT ADULT HEAD (MISCELLANEOUS) ×2 IMPLANT
DRAIN PENROSE 1/4X12 LTX STRL (WOUND CARE) ×2 IMPLANT
DRAPE EXTREMITY T 121X128X90 (DRAPE) ×2 IMPLANT
DRAPE ORTHO SPLIT 77X108 STRL (DRAPES) ×4
DRAPE PROXIMA HALF (DRAPES) ×4 IMPLANT
DRAPE SURG ORHT 6 SPLT 77X108 (DRAPES) IMPLANT
DRAPE U-SHAPE 47X51 STRL (DRAPES) ×2 IMPLANT
ELECT COATED BLADE 2.86 ST (ELECTRODE) ×4 IMPLANT
ELECT NDL TIP 2.8 STRL (NEEDLE) IMPLANT
ELECT NEEDLE TIP 2.8 STRL (NEEDLE) IMPLANT
ELECT REM PT RETURN 9FT ADLT (ELECTROSURGICAL) ×4
ELECTRODE REM PT RTRN 9FT ADLT (ELECTROSURGICAL) ×2 IMPLANT
GAUZE SPONGE 4X4 12PLY STRL (GAUZE/BANDAGES/DRESSINGS) ×2 IMPLANT
GAUZE SPONGE 4X4 16PLY XRAY LF (GAUZE/BANDAGES/DRESSINGS) ×6 IMPLANT
GAUZE XEROFORM 1X8 LF (GAUZE/BANDAGES/DRESSINGS) ×2 IMPLANT
GLOVE BIO SURGEON STRL SZ7.5 (GLOVE) ×10 IMPLANT
GLOVE BIO SURGEON STRL SZ8 (GLOVE) ×2 IMPLANT
GLOVE BIOGEL PI IND STRL 6.5 (GLOVE) IMPLANT
GLOVE BIOGEL PI IND STRL 7.5 (GLOVE) IMPLANT
GLOVE BIOGEL PI IND STRL 8 (GLOVE) IMPLANT
GLOVE BIOGEL PI INDICATOR 6.5 (GLOVE) ×4
GLOVE BIOGEL PI INDICATOR 7.5 (GLOVE) ×2
GLOVE BIOGEL PI INDICATOR 8 (GLOVE) ×4
GLOVE SURG SS PI 6.5 STRL IVOR (GLOVE) ×8 IMPLANT
GOWN STRL REUS W/ TWL LRG LVL3 (GOWN DISPOSABLE) ×4 IMPLANT
GOWN STRL REUS W/ TWL XL LVL3 (GOWN DISPOSABLE) IMPLANT
GOWN STRL REUS W/TWL LRG LVL3 (GOWN DISPOSABLE) ×12
GOWN STRL REUS W/TWL XL LVL3 (GOWN DISPOSABLE) ×8
KIT BASIN OR (CUSTOM PROCEDURE TRAY) ×4 IMPLANT
KIT ROOM TURNOVER OR (KITS) ×4 IMPLANT
NDL HYPO 25GX1X1/2 BEV (NEEDLE) IMPLANT
NEEDLE HYPO 25GX1X1/2 BEV (NEEDLE) ×4 IMPLANT
NS IRRIG 1000ML POUR BTL (IV SOLUTION) ×8 IMPLANT
PAD ARMBOARD 7.5X6 YLW CONV (MISCELLANEOUS) ×8 IMPLANT
PAD CAST 4YDX4 CTTN HI CHSV (CAST SUPPLIES) IMPLANT
PADDING CAST ABS 4INX4YD NS (CAST SUPPLIES) ×2
PADDING CAST ABS COTTON 4X4 ST (CAST SUPPLIES) IMPLANT
PADDING CAST COTTON 4X4 STRL (CAST SUPPLIES) ×4
PENCIL BUTTON HOLSTER BLD 10FT (ELECTRODE) ×4 IMPLANT
SPLINT PLASTER EXTRA FAST 3X15 (CAST SUPPLIES) ×2
SPLINT PLASTER GYPS XFAST 3X15 (CAST SUPPLIES) IMPLANT
SPONGE LAP 18X18 X RAY DECT (DISPOSABLE) ×2 IMPLANT
STAPLER VISISTAT 35W (STAPLE) ×2 IMPLANT
SUT CHROMIC 4 0 P 3 18 (SUTURE) ×2 IMPLANT
SUT ETHILON 2 0 FS 18 (SUTURE) ×2 IMPLANT
SUT ETHILON 3 0 PS 1 (SUTURE) ×2 IMPLANT
SUT ETHILON 4 0 PS 2 18 (SUTURE) ×2 IMPLANT
SUT ETHILON 5 0 P 3 18 (SUTURE)
SUT NYLON ETHILON 5-0 P-3 1X18 (SUTURE) ×2 IMPLANT
SUT PROLENE 5 0 P 3 (SUTURE) ×14 IMPLANT
SUT SILK 4 0 (SUTURE)
SUT SILK 4-0 18XBRD TIE 12 (SUTURE) ×2 IMPLANT
SUT VIC AB 4-0 RB1 27 (SUTURE) ×16
SUT VIC AB 4-0 RB1 27X BRD (SUTURE) IMPLANT
TOWEL OR 17X24 6PK STRL BLUE (TOWEL DISPOSABLE) ×4 IMPLANT
TRAY ENT MC OR (CUSTOM PROCEDURE TRAY) ×4 IMPLANT
TRAY FOLEY CATH 16FRSI W/METER (SET/KITS/TRAYS/PACK) ×2 IMPLANT
WATER STERILE IRR 1000ML POUR (IV SOLUTION) ×2 IMPLANT

## 2016-12-31 NOTE — Op Note (Signed)
NAMEVERDIA, BOLT                   ACCOUNT NO.:  [id number]  MEDICAL RECORD NO.:  [id number]  LOCATION:                                 FACILITY:  PHYSICIAN:  Vanita Panda. Magnus Ivan, M.D.DATE OF BIRTH:  [birth year]  DATE OF PROCEDURE:  12/31/2016 DATE OF DISCHARGE:                              OPERATIVE REPORT   PREOPERATIVE DIAGNOSIS:  Dog bite wounds to left wrist with volar and dorsal puncture wounds as well as a nondisplaced distal radius fracture.  POSTOPERATIVE DIAGNOSIS:  Dog bite wounds to left wrist with volar and dorsal puncture wounds as well as a nondisplaced distal radius fracture.  PROCEDURES:  Irrigation and debridement of left dorsal and volar wrist wounds including debridement of skin only; sharp excisional debridement of skin only; and irrigation of skin, soft tissue, fascia, and bone.  SURGEON:  Vanita Panda. Magnus Ivan, M.D.  ASSISTANT:  Richardean Canal, PA-C.  ANESTHESIA:  General.  ANTIBIOTICS:  IV Zosyn.  BLOOD LOSS:  Minimal.  COMPLICATIONS:  None.  INDICATIONS:  Ms. Laura [name] is a 51 year old female who I was asked to take care of while she is already in the operating room for injury sustained when she was attacked by her own dog.  She has extensive soft tissue injury to her right ear, and this is being taken care of by Dr. Christia Reading from Otolaryngology.  She also has dog bite wounds on her left wrist dorsally and volarly and she also has a nondisplaced left distal radius fracture.  Given these puncture wounds, it was felt that irrigation and debridement was needed of the wrist.  Informed consent was obtained from her daughter and from the patient prior to [place] proceeding.  PROCEDURE DESCRIPTION:  She was already intubated and asleep on the operating table with the ENT surgeon, Dr. Jenne Pane, already working on her ear.  We were able to pre-scrub the left wrist, forearm, and hand with soap and water.  We then used Betadine to clean the wrist  thoroughly. Sterile drapes were then applied all around the wrist and forearm.  A time-out was called to identify correct patient and correct left wrist. We then first started volarly and extended 2 puncture wounds to open up the volar distal radius.  We found the radial artery and the median nerve to be intact.  Once we opened this up, we irrigated the soft tissue thoroughly, sharply excising just some necrotic skin with a #15 blade.  We irrigated all the way down to the bone and put several liters of fluid through the wrist using bulb syringe.  We then loosely reapproximated the skin volarly with interrupted 3-0 nylon suture.  We then turned the wrist over and found 2 dorsal puncture wounds.  We connected these with a #15 blade and was able to excise some necrotic skin.  We then just explored the soft tissue and found no openings to any tendon.  Using a bulb syringe, we then irrigated with several liters of normal saline solution through this wound.  We reapproximated it as well with interrupted 3-0 nylon suture.  Xeroform and well-padded sterile dressing were applied, and then a volar short plaster splint was  applied for distal radius fracture.  This fracture can be treated nonoperatively.  After the dressings were applied, the case was still proceeding for the Ear, Nose, and Throat physician aspect of their repair of her ear soft tissue.     Vanita Pandahristopher Y. Magnus IvanBlackman, M.D.   ______________________________ Vanita Pandahristopher Y. Magnus IvanBlackman, M.D.    CYB/MEDQ  D:  12/31/2016  T:  12/31/2016  Job:  [id number]

## 2016-12-31 NOTE — ED Notes (Signed)
Pt. Arrived in Trauma A and paramedics informed that pt.s HR dropped to 20 , Bp 80 systolic and skin was grey and clammy.  Dr. Prince Solianeggler called Level  1 activated at 08:11.

## 2016-12-31 NOTE — ED Triage Notes (Signed)
Pt. Was laying in the bed with her Pit bull and he attacked while in bed.  She got out of bed and he attacked again and pt. Was able to get outside and call for help.  Pt. Arrived, GCS 15,  Skin is pale grey and diaphoretic.  Dressing in place to wounds.  Bleeding controlled

## 2016-12-31 NOTE — Progress Notes (Signed)
Orthopedic Tech Progress Note Patient Details:  Laura BolkLisa M [name] 1966-05-29 [id number]  Patient ID: Laura BolkLisa M [name], female   DOB: 1966-05-29, 51 y.o.   MRN: [id number]   Nikki DomCrawford, Char Feltman 12/31/2016, 8:24 AM Made level 1 trauma visit

## 2016-12-31 NOTE — Consult Note (Signed)
Reason for Consult: Facial lacerations Referring Physician: Trauma  Laura [name] is an 51 y.o. female.  HPI: 51 year old female brought to ER by EMS as level 1 trauma following an attack by her pitbull.  She was found to have soft tissue injuries to the face, right ear, and occipital scalp.  A left wrist injury was also found.  CT imaging demonstrates no facial bone fracture.  Past Medical History:  Diagnosis Date  . Allergy   . Anxiety   . Depression     Past Surgical History:  Procedure Laterality Date  . BUNIONECTOMY  2004   R foot    Family History  Problem Relation Age of Onset  . Stroke Mother   . Scoliosis Mother   . Gallbladder disease Mother   . Anxiety disorder Mother   . Depression Mother   . Heart disease Maternal Grandmother   . Stroke Maternal Grandmother   . Cancer Paternal Grandmother     breast  . Heart disease Paternal Grandfather   . GER disease Daughter   . Gallbladder disease Daughter     Social History:  reports that she has never smoked. She has never used smokeless tobacco. She reports that she does not drink alcohol or use drugs.  Allergies:  Allergies  Allergen Reactions  . Flagyl [Metronidazole] Rash    Medications: I have reviewed the patient's current medications.  Results for orders placed or performed during the hospital encounter of 12/31/16 (from the past 48 hour(s))  Prepare fresh frozen plasma     Status: None   Collection Time: 12/31/16  8:10 AM  Result Value Ref Range   ISSUE DATE / TIME 119147829562    Blood Product Unit Number [id number]    PRODUCT CODE X5284X32    Unit Type and Rh 0600    Blood Product Expiration Date 440102725366    ISSUE DATE / TIME 440347425956    Blood Product Unit Number [id number]    PRODUCT CODE [id number]    Unit Type and Rh 6200    Blood Product Expiration Date 016010932355   Type and screen     Status: None   Collection Time: 12/31/16  8:19 AM  Result Value Ref Range   ABO/RH(D) O POS     Antibody Screen NEG    Sample Expiration 01/03/2017    Unit Number [id number]    Blood Component Type RED CELLS,LR    Unit division 00    Status of Unit REL FROM [facility]    Unit tag comment VERBAL ORDERS PER DR TEGLER    Transfusion Status OK TO TRANSFUSE    Crossmatch Result PENDING    Unit Number [id number]    Blood Component Type RBC LR PHER1    Unit division 00    Status of Unit REL FROM [facility]    Unit tag comment VERBAL ORDERS PER DR TEGLER    Transfusion Status OK TO TRANSFUSE    Crossmatch Result PENDING   CDS serology     Status: None   Collection Time: 12/31/16  8:19 AM  Result Value Ref Range   CDS serology specimen STAT   Comprehensive metabolic panel     Status: Abnormal   Collection Time: 12/31/16  8:19 AM  Result Value Ref Range   Sodium 138 135 - 145 mmol/L   Potassium 3.7 3.5 - 5.1 mmol/L   Chloride 108 101 - 111 mmol/L   CO2 23 22 - 32 mmol/L   Glucose, Bld 108 (H)  65 - 99 mg/dL   BUN 18 6 - 20 mg/dL   Creatinine, Ser 1.06 (H) 0.44 - 1.00 mg/dL   Calcium 8.7 (L) 8.9 - 10.3 mg/dL   Total Protein 6.0 (L) 6.5 - 8.1 g/dL   Albumin 3.4 (L) 3.5 - 5.0 g/dL   AST 20 15 - 41 U/L   ALT 14 14 - 54 U/L   Alkaline Phosphatase 31 (L) 38 - 126 U/L   Total Bilirubin 0.5 0.3 - 1.2 mg/dL   GFR calc non Af Amer 60 (L) >60 mL/min   GFR calc Af Amer >60 >60 mL/min    Comment: (NOTE) The eGFR has been calculated using the CKD EPI equation. This calculation has not been validated in all clinical situations. eGFR's persistently <60 mL/min signify possible Chronic Kidney Disease.    Anion gap 7 5 - 15  CBC     Status: Abnormal   Collection Time: 12/31/16  8:19 AM  Result Value Ref Range   WBC 11.2 (H) 4.0 - 10.5 K/uL   RBC 3.82 (L) 3.87 - 5.11 MIL/uL   Hemoglobin 10.8 (L) 12.0 - 15.0 g/dL   HCT 34.9 (L) 36.0 - 46.0 %   MCV 91.4 78.0 - 100.0 fL   MCH 28.3 26.0 - 34.0 pg   MCHC 30.9 30.0 - 36.0 g/dL   RDW 14.1 11.5 - 15.5 %   Platelets 296 150 - 400 K/uL  Ethanol      Status: None   Collection Time: 12/31/16  8:19 AM  Result Value Ref Range   Alcohol, Ethyl (B) <5 <5 mg/dL    Comment:        LOWEST DETECTABLE LIMIT FOR SERUM ALCOHOL IS 5 mg/dL FOR MEDICAL PURPOSES ONLY   Protime-INR     Status: None   Collection Time: 12/31/16  8:19 AM  Result Value Ref Range   Prothrombin Time 13.1 11.4 - 15.2 seconds   INR 0.99   ABO/Rh     Status: None (Preliminary result)   Collection Time: 12/31/16  8:19 AM  Result Value Ref Range   ABO/RH(D) O POS   I-Stat Chem 8, ED     Status: Abnormal   Collection Time: 12/31/16  8:36 AM  Result Value Ref Range   Sodium 142 135 - 145 mmol/L   Potassium 3.7 3.5 - 5.1 mmol/L   Chloride 108 101 - 111 mmol/L   BUN 22 (H) 6 - 20 mg/dL   Creatinine, Ser 1.10 (H) 0.44 - 1.00 mg/dL   Glucose, Bld 106 (H) 65 - 99 mg/dL   Calcium, Ion 1.11 (L) 1.15 - 1.40 mmol/L   TCO2 23 0 - 100 mmol/L   Hemoglobin 11.9 (L) 12.0 - 15.0 g/dL   HCT 35.0 (L) 36.0 - 46.0 %  I-Stat CG4 Lactic Acid, ED     Status: None   Collection Time: 12/31/16  8:36 AM  Result Value Ref Range   Lactic Acid, Venous 1.13 0.5 - 1.9 mmol/L    Dg Forearm Left  Result Date: 12/31/2016 CLINICAL DATA:  Dog bite today.  Soft tissue injury. EXAM: LEFT FOREARM - 2 VIEW COMPARISON:  None. FINDINGS: No proximal abnormality. Soft tissue injury in the region of the distal forearm and wrist. Apparent cortical puncture fracture of the volar distal radius. Question small radiopaque abnormality which could be a bone fragment or foreign object. IMPRESSION: Apparent cortical puncture fracture of the volar distal radius. Question small loose body or bone fragment. Electronically Signed  By: Nelson Chimes M.D.   On: 12/31/2016 09:40   Dg Wrist Complete Left  Result Date: 12/31/2016 CLINICAL DATA:  Dog bite injury today. EXAM: LEFT WRIST - COMPLETE 3+ VIEW COMPARISON:  Forearm same day. FINDINGS: Diona Foley are cortical puncture fracture of the distal radial metaphysis. Small  radiopaque structure in the soft tissues could be bone fragment or foreign object. I do not see any ear in any of the joints, but there is obvious air in the extra synovial soft tissues. IMPRESSION: Puncture fracture of the volar distal radial metaphysis. Question bone fragment or foreign object nearby. Electronically Signed   By: Nelson Chimes M.D.   On: 12/31/2016 09:42   Ct Head Wo Contrast  Result Date: 12/31/2016 CLINICAL DATA:  Dog bite to the right side of the face with severe right ear laceration. EXAM: CT HEAD WITHOUT CONTRAST CT MAXILLOFACIAL WITHOUT CONTRAST CT CERVICAL SPINE WITHOUT CONTRAST TECHNIQUE: Contiguous axial images were obtained from the base of the skull through the vertex without intravenous contrast. Multidetector CT imaging of the maxillofacial structures was performed. Multiplanar CT image reconstructions were also generated. A small metallic BB was placed on the right temple in order to reliably differentiate right from left. Multidetector CT imaging of the cervical spine was performed without intravenous contrast. Multiplanar CT image reconstructions were also generated. COMPARISON:  None. FINDINGS: CT HEAD FINDINGS Brain: No evidence of acute infarction, hemorrhage, hydrocephalus, extra-axial collection or mass lesion/mass effect. Vascular: No hyperdense vessel or unexpected calcification. Skull: Normal. Negative for fracture or focal lesion. Other: Right-sided facial hematoma. CT MAXILLOFACIAL FINDINGS Osseous: No fracture or mandibular dislocation. No destructive process. Orbits: Negative. No traumatic or inflammatory finding. Right periorbital hematoma. Sinuses: Clear. Soft tissues: Hematoma to the right sided of the face with small amounts of soft tissue emphysema tracking into the right perinasal soft tissues. Severe laceration of the right ear. Normal appearance of the right parotid gland. CT CERVICAL SPINE FINDINGS Alignment: Mild straightening of the cervical lordosis. Skull  base and vertebrae: No acute fracture. No primary bone lesion or focal pathologic process. Soft tissues and spinal canal: No prevertebral fluid or swelling. No visible canal hematoma. Disc levels:  No significant degenerative disc disease. Upper chest: Negative. Other: None. IMPRESSION: No evidence of acute intracranial injury. No evidence of acute injury to the cervical spine. No evidence of facial fractures. Extensive soft tissue trauma to the right side of the face, including right preseptal hematoma. Soft tissue emphysema tracks within the soft tissues of the right face and into the nasal soft tissues. Severe right ear laceration.  Right parotid gland is intact. Electronically Signed   By: Fidela Salisbury M.D.   On: 12/31/2016 09:25   Ct Cervical Spine Wo Contrast  Result Date: 12/31/2016 CLINICAL DATA:  Dog bite to the right side of the face with severe right ear laceration. EXAM: CT HEAD WITHOUT CONTRAST CT MAXILLOFACIAL WITHOUT CONTRAST CT CERVICAL SPINE WITHOUT CONTRAST TECHNIQUE: Contiguous axial images were obtained from the base of the skull through the vertex without intravenous contrast. Multidetector CT imaging of the maxillofacial structures was performed. Multiplanar CT image reconstructions were also generated. A small metallic BB was placed on the right temple in order to reliably differentiate right from left. Multidetector CT imaging of the cervical spine was performed without intravenous contrast. Multiplanar CT image reconstructions were also generated. COMPARISON:  None. FINDINGS: CT HEAD FINDINGS Brain: No evidence of acute infarction, hemorrhage, hydrocephalus, extra-axial collection or mass lesion/mass effect. Vascular: No hyperdense vessel  or unexpected calcification. Skull: Normal. Negative for fracture or focal lesion. Other: Right-sided facial hematoma. CT MAXILLOFACIAL FINDINGS Osseous: No fracture or mandibular dislocation. No destructive process. Orbits: Negative. No  traumatic or inflammatory finding. Right periorbital hematoma. Sinuses: Clear. Soft tissues: Hematoma to the right sided of the face with small amounts of soft tissue emphysema tracking into the right perinasal soft tissues. Severe laceration of the right ear. Normal appearance of the right parotid gland. CT CERVICAL SPINE FINDINGS Alignment: Mild straightening of the cervical lordosis. Skull base and vertebrae: No acute fracture. No primary bone lesion or focal pathologic process. Soft tissues and spinal canal: No prevertebral fluid or swelling. No visible canal hematoma. Disc levels:  No significant degenerative disc disease. Upper chest: Negative. Other: None. IMPRESSION: No evidence of acute intracranial injury. No evidence of acute injury to the cervical spine. No evidence of facial fractures. Extensive soft tissue trauma to the right side of the face, including right preseptal hematoma. Soft tissue emphysema tracks within the soft tissues of the right face and into the nasal soft tissues. Severe right ear laceration.  Right parotid gland is intact. Electronically Signed   By: Fidela Salisbury M.D.   On: 12/31/2016 09:25   Dg Chest Portable 1 View  Result Date: 12/31/2016 CLINICAL DATA:  Dull bite to the left ear.  No chest trauma EXAM: PORTABLE CHEST 1 VIEW COMPARISON:  Chest x-ray of January 11, 2014 FINDINGS: The lungs are slightly less well inflated today. The lung markings are coarse at both bases but this is not new and is accentuated by mild hypoinflation. The heart and pulmonary vascularity are normal. The mediastinum is normal in width. The bony thorax is unremarkable. IMPRESSION: There is no acute cardiopulmonary abnormality. Electronically Signed   By: David  Martinique M.D.   On: 12/31/2016 08:54   Ct Maxillofacial Wo Contrast  Result Date: 12/31/2016 CLINICAL DATA:  Dog bite to the right side of the face with severe right ear laceration. EXAM: CT HEAD WITHOUT CONTRAST CT MAXILLOFACIAL WITHOUT  CONTRAST CT CERVICAL SPINE WITHOUT CONTRAST TECHNIQUE: Contiguous axial images were obtained from the base of the skull through the vertex without intravenous contrast. Multidetector CT imaging of the maxillofacial structures was performed. Multiplanar CT image reconstructions were also generated. A small metallic BB was placed on the right temple in order to reliably differentiate right from left. Multidetector CT imaging of the cervical spine was performed without intravenous contrast. Multiplanar CT image reconstructions were also generated. COMPARISON:  None. FINDINGS: CT HEAD FINDINGS Brain: No evidence of acute infarction, hemorrhage, hydrocephalus, extra-axial collection or mass lesion/mass effect. Vascular: No hyperdense vessel or unexpected calcification. Skull: Normal. Negative for fracture or focal lesion. Other: Right-sided facial hematoma. CT MAXILLOFACIAL FINDINGS Osseous: No fracture or mandibular dislocation. No destructive process. Orbits: Negative. No traumatic or inflammatory finding. Right periorbital hematoma. Sinuses: Clear. Soft tissues: Hematoma to the right sided of the face with small amounts of soft tissue emphysema tracking into the right perinasal soft tissues. Severe laceration of the right ear. Normal appearance of the right parotid gland. CT CERVICAL SPINE FINDINGS Alignment: Mild straightening of the cervical lordosis. Skull base and vertebrae: No acute fracture. No primary bone lesion or focal pathologic process. Soft tissues and spinal canal: No prevertebral fluid or swelling. No visible canal hematoma. Disc levels:  No significant degenerative disc disease. Upper chest: Negative. Other: None. IMPRESSION: No evidence of acute intracranial injury. No evidence of acute injury to the cervical spine. No evidence of facial fractures.  Extensive soft tissue trauma to the right side of the face, including right preseptal hematoma. Soft tissue emphysema tracks within the soft tissues of the  right face and into the nasal soft tissues. Severe right ear laceration.  Right parotid gland is intact. Electronically Signed   By: Fidela Salisbury M.D.   On: 12/31/2016 09:25    Review of Systems  All other systems reviewed and are negative.  Blood pressure 114/73, pulse 66, temperature 97.6 F (36.4 C), temperature source Oral, resp. rate 15, height '5\' 4"'$  (1.626 m), weight 158 lb (71.7 kg), last menstrual period 12/31/2016, SpO2 99 %. Physical Exam  Constitutional: She is oriented to person, place, and time. She appears well-developed and well-nourished. No distress.  HENT:  Left Ear: External ear normal.  Mouth/Throat: Oropharynx is clear and moist.  Right external ear with complex laceration/avulsion involving upper two thirds with exposed cartilage.  Earlobe intact and normally attached.  Few puncture wounds on nasal dorsum.  Nose stable.  Midface stable.  Horizontal laceration of inferior right lower lid.  Occipital laceration.  Eyes: Conjunctivae and EOM are normal. Pupils are equal, round, and reactive to light.  Neck: Normal range of motion. Neck supple.  Cardiovascular: Normal rate.   Respiratory: Effort normal.  Musculoskeletal: Normal range of motion.  Neurological: She is alert and oriented to person, place, and time. No cranial nerve deficit.  Skin: Skin is warm and dry.  Psychiatric: She has a normal mood and affect. Her behavior is normal. Judgment and thought content normal.    Assessment/Plan: Right ear laceration/avulsion, right lower eyelid laceration, nasal lacerations, and occipital laceration.  She will go to the OR for closure of lacerations.  Risks, benefits, and alternatives were discussed and she expressed understanding and agreement.  Orthopedics will address the wrist at the same setting.   Zurii Hewes 12/31/2016, 10:25 AM

## 2016-12-31 NOTE — Anesthesia Preprocedure Evaluation (Addendum)
Anesthesia Evaluation  Patient identified by MRN, date of birth, ID band Patient awake    Reviewed: Allergy & Precautions, NPO status , Patient's Chart, lab work & pertinent test results  Airway Mallampati: III       Dental no notable dental hx. (+) Teeth Intact, Dental Advisory Given   Pulmonary neg pulmonary ROS,    Pulmonary exam normal breath sounds clear to auscultation       Cardiovascular negative cardio ROS Normal cardiovascular exam Rhythm:Regular Rate:Normal     Neuro/Psych PSYCHIATRIC DISORDERS Anxiety Depression ADHDnegative neurological ROS     GI/Hepatic negative GI ROS, Neg liver ROS,   Endo/Other  negative endocrine ROS  Renal/GU negative Renal ROS  negative genitourinary   Musculoskeletal Dog Bite- Face and scalp, right side including orbit   Abdominal   Peds  Hematology  (+) anemia ,   Anesthesia Other Findings   Reproductive/Obstetrics                            Anesthesia Physical Anesthesia Plan  ASA: II  Anesthesia Plan: General   Post-op Pain Management:    Induction: Intravenous  Airway Management Planned: Oral ETT  Additional Equipment:   Intra-op Plan:   Post-operative Plan: Extubation in OR  Informed Consent: I have reviewed the patients History and Physical, chart, labs and discussed the procedure including the risks, benefits and alternatives for the proposed anesthesia with the patient or authorized representative who has indicated his/her understanding and acceptance.   Dental advisory given  Plan Discussed with: CRNA, Anesthesiologist and Surgeon  Anesthesia Plan Comments:         Anesthesia Quick Evaluation

## 2016-12-31 NOTE — H&P (Signed)
[facility] Admission Note  Romualdo BolkLisa M Light May 28, 1966  [id number].     Requesting MD: Rush Landmarkegeler, MD  Chief Complaint/Reason for Consult: Dog bite, face HPI:  Laura [name] is a 51 year-old female who [facility] after she was attacked by her pitbull at home today. She was at home alone and called EMS after her pitbull attacked her, biting her "by her hair". GCS 15 upon arrival. Patient was not initially a level one trauma however she was upgraded to a level one in the ED when she became hypotensive 80's/50's, pressures increased appropriately with IVF. She denies a significant fall or LOC. She reports that her dog has received rabies vaccinations. Patient denies any medical problems and reports taking a multivitamin daily along with a new medication, Norethisterone, prescribed by her OB/Gyn for fibroids. She denies use of blood thinning medications. Unable to report when her last tetanus vaccination was. She reports hives with use of metronidazole. She works for [employer]. She denies tobacco, drug, or alcohol use.   ROS: Review of Systems  Constitutional: Negative for chills and fever.  HENT: Positive for ear pain. Negative for hearing loss.   Eyes: Positive for blurred vision. Negative for pain.  Respiratory: Negative for cough, hemoptysis and shortness of breath.   Cardiovascular: Negative for chest pain and palpitations.  Gastrointestinal: Negative for abdominal pain, heartburn, nausea and vomiting.  Musculoskeletal: Negative for back pain and neck pain.       Left wrist pain   Family History  Problem Relation Age of Onset  . Stroke Mother   . Scoliosis Mother   . Gallbladder disease Mother   . Anxiety disorder Mother   . Depression Mother   . Heart disease Maternal Grandmother   . Stroke Maternal Grandmother   . Cancer Paternal Grandmother     breast  . Heart disease Paternal Grandfather   . GER disease Daughter   . Gallbladder disease Daughter     Past Medical  History:  Diagnosis Date  . Allergy   . Anxiety   . Depression     Past Surgical History:  Procedure Laterality Date  . BUNIONECTOMY  2004   R foot    Social History:  reports that she has never smoked. She does not have any smokeless tobacco history on file. She reports that she does not drink alcohol or use drugs.  Allergies:  Allergies  Allergen Reactions  . Flagyl [Metronidazole] Rash    (Not in a hospital admission)  SpO2 99 %. Physical Exam: Physical Exam  Constitutional: She is oriented to person, place, and time. She appears distressed.  HENT:  Head:    Left Ear: External ear normal.  Nose: Nose normal.  Mouth/Throat: Oropharynx is clear and moist.  Eyes: EOM are normal. Pupils are equal, round, and reactive to light. Right eye exhibits no discharge. Left eye exhibits no discharge. No scleral icterus.  Neck: Normal range of motion. Neck supple. No JVD present. No tracheal deviation present. No thyromegaly present.  Cardiovascular: Regular rhythm, normal heart sounds and intact distal pulses.  Exam reveals no gallop and no friction rub.   No murmur heard. Tachycardic.  Pulmonary/Chest: Effort normal and breath sounds normal. No respiratory distress. She has no wheezes. She has no rales. She exhibits no tenderness.  Abdominal: Soft. She exhibits no distension and no mass. There is no tenderness. There is no rebound and no guarding.  Musculoskeletal:       Arms: Non-tender over spinous  process. No step-off.  Neurological: She is alert and oriented to person, place, and time. GCS score is 15.  Skin: Skin is warm and dry.  Psychiatric: Memory normal.    Results for orders placed or performed during the hospital encounter of 12/31/16 (from the past 48 hour(s))  Type and screen     Status: None (Preliminary result)   Collection Time: 12/31/16  8:10 AM  Result Value Ref Range   ABO/RH(D) PENDING    Antibody Screen PENDING    Sample Expiration 01/03/2017    Unit  Number [id number]    Blood Component Type RED CELLS,LR    Unit division 00    Status of Unit ISSUED    Unit tag comment VERBAL ORDERS PER DR TEGLER    Transfusion Status OK TO TRANSFUSE    Crossmatch Result PENDING    Unit Number [id number]    Blood Component Type RBC LR PHER1    Unit division 00    Status of Unit ISSUED    Unit tag comment VERBAL ORDERS PER DR TEGLER    Transfusion Status OK TO TRANSFUSE    Crossmatch Result PENDING   Prepare fresh frozen plasma     Status: None (Preliminary result)   Collection Time: 12/31/16  8:10 AM  Result Value Ref Range   Unit Number [id number]    Blood Component Type LIQ PLASMA    Unit division 00    Status of Unit ISSUED    Unit tag comment VERBAL ORDERS PER DR TEGLER    Transfusion Status OK TO TRANSFUSE    Unit Number [id number]    Blood Component Type LIQ PLASMA    Unit division 00    Status of Unit ISSUED    Unit tag comment VERBAL ORDERS PER DR TEGLER    Transfusion Status OK TO TRANSFUSE    No results found.  Assessment/Plan Dog bite - Tdap administered in ED Near complete avulsion right ear - ENT consult, Jenne Pane; no intracranial or c-spine injury on CT. Infraorbital laceration - No facial Fx appreciated on CT maxillofacial  Occipital scalp laceration  Left wrist laceration Left wrist fracture - Ortho surgery consult, Handy; Puncture fracture of the volar distal radial metaphysis   FEN: NPO, IVF  ID: Ancef 2g in ED  VTE: SCD's   Plan: Admit to trauma service NPO, IVF, pain control  ENT/Ortho surgery consults    Adam Phenix, [facility] 12/31/2016, 8:27 AM Pager: [phone] Consults: [phone] Mon-Fri 7:00 am-4:30 pm Sat-Sun 7:00 am-11:30 am

## 2016-12-31 NOTE — Transfer of Care (Signed)
Immediate Anesthesia Transfer of Care Note  Patient: Laura [name]  Procedure(s) Performed: Procedure(s): CLOSURE OF FACIAL LACERATION (N/A) IRRIGATION AND DEBRIDEMENT WOUND (Left)  Patient Location: PACU  Anesthesia Type:General  Level of Consciousness: sedated  Airway & Oxygen Therapy: Patient Spontanous Breathing and Patient connected to nasal cannula oxygen  Post-op Assessment: Report given to RN and Post -op Vital signs reviewed and stable  Post vital signs: Reviewed and stable  Last Vitals:  Vitals:   12/31/16 0900 12/31/16 0930  BP: 122/67 114/73  Pulse: 71 66  Resp: 13 15  Temp:      Last Pain:  Vitals:   12/31/16 0810  TempSrc:   PainSc: 8          Complications: No apparent anesthesia complications

## 2016-12-31 NOTE — Anesthesia Procedure Notes (Signed)
Procedure Name: Intubation Date/Time: 12/31/2016 10:46 AM Performed by: Edmonia CaprioAUSTON, Estus Krakowski M Pre-anesthesia Checklist: Patient identified, Emergency Drugs available, Suction available, Patient being monitored and Timeout performed Patient Re-evaluated:Patient Re-evaluated prior to inductionOxygen Delivery Method: Circle system utilized Preoxygenation: Pre-oxygenation with 100% oxygen Intubation Type: IV induction Ventilation: Mask ventilation without difficulty Laryngoscope Size: Miller and 2 Grade View: Grade I Tube type: Oral Tube size: 7.5 mm Number of attempts: 1 Airway Equipment and Method: Stylet Placement Confirmation: ETT inserted through vocal cords under direct vision,  positive ETCO2 and breath sounds checked- equal and bilateral Secured at: 22 cm Tube secured with: Tape Dental Injury: Teeth and Oropharynx as per pre-operative assessment

## 2016-12-31 NOTE — Brief Op Note (Signed)
12/31/2016  12:04 PM  PATIENT:  Laura [name]  51 y.o. female  PRE-OPERATIVE DIAGNOSIS:  lacerations  POST-OPERATIVE DIAGNOSIS:  * No post-op diagnosis entered *  PROCEDURE:  Procedure(s): CLOSURE OF FACIAL LACERATION (N/A) IRRIGATION AND DEBRIDEMENT WOUND (Left)  SURGEON:  Surgeon(s) and Role: Panel 1:    * Christia Readingwight Bates, MD - Primary  Panel 2:    * Kathryne Hitchhristopher Y Marcel Sorter, MD - Primary  PHYSICIAN ASSISTANT: Rexene EdisonGil Clark, PA-C  ANESTHESIA:   general  EBL:  Total I/O In: -  Out: 300 [Urine:300]  COUNTS:  YES  TOURNIQUET: none  DICTATION: .Other Dictation: Dictation Number [id number]  PLAN OF CARE: Admit to inpatient   PATIENT DISPOSITION:  PACU - hemodynamically stable.   Delay start of Pharmacological VTE agent (>24hrs) due to surgical blood loss or risk of bleeding: no

## 2016-12-31 NOTE — ED Notes (Signed)
Report called to short stay RN. 

## 2016-12-31 NOTE — ED Provider Notes (Signed)
[facility]-EMERGENCY DEPT Provider Note   CSN: [id number] Arrival date & time: 12/31/16  0808     History   Chief Complaint Chief Complaint  Patient presents with  . Animal Bite    HPI Laura [name] is a 51 y.o. female.  The history is provided by the patient. No language interpreter was used.  Animal Bite  Contact animal:  Dog Location:  Head/neck and shoulder/arm Head/neck injury location:  Head, scalp and R ear Shoulder/arm injury location:  L forearm and L wrist Pain details:    Quality:  Sharp and aching   Severity:  Severe   Timing:  Constant   Progression:  Unchanged Incident location:  Home Provoked: unknown.   Notifications:  None Animal's rabies vaccination status:  Unknown Animal in possession: yes   Tetanus status:  Out of date Relieved by:  Nothing Worsened by:  Nothing Ineffective treatments:  None tried Associated symptoms: no fever, no numbness, no rash and no swelling     Past Medical History:  Diagnosis Date  . Allergy   . Anxiety   . Depression     Patient Active Problem List   Diagnosis Date Noted  . Attention deficit disorder without mention of hyperactivity 09/01/2013  . Attention deficit disorder with hyperactivity(314.01) 03/10/2013  . ADD (attention deficit disorder) 01/23/2013    Past Surgical History:  Procedure Laterality Date  . BUNIONECTOMY  2004   R foot    OB History    No data available       Home Medications    Prior to Admission medications   Medication Sig Start Date End Date Taking? Authorizing Provider  lisdexamfetamine (VYVANSE) 70 MG capsule Fill after 3/5 /2016 01/18/15   Archer Asa, MD  loperamide (IMODIUM A-D) 2 MG tablet 2 now and one hourly prn diarrhea.  Max 8 tabs in 24 hours Patient not taking: Reported on 01/18/2015 01/11/14   Carmelina Dane, MD  ondansetron (ZOFRAN-ODT) 8 MG disintegrating tablet Take 1 tablet (8 mg total) by mouth every 8 (eight) hours as needed for nausea. Patient not taking:  Reported on 01/18/2015 01/11/14   Carmelina Dane, MD  traMADol (ULTRAM) 50 MG tablet Take 1 tablet (50 mg total) by mouth every 6 (six) hours as needed. Patient not taking: Reported on 01/18/2015 11/27/13   Roxy Horseman, PA-C  vancomycin (VANCOCIN) 125 MG capsule Take 1 capsule (125 mg total) by mouth 4 (four) times daily. 01/13/14   Sondra Barges, PA-C    Family History Family History  Problem Relation Age of Onset  . Stroke Mother   . Scoliosis Mother   . Gallbladder disease Mother   . Anxiety disorder Mother   . Depression Mother   . Heart disease Maternal Grandmother   . Stroke Maternal Grandmother   . Cancer Paternal Grandmother     breast  . Heart disease Paternal Grandfather   . GER disease Daughter   . Gallbladder disease Daughter     Social History Social History  Substance Use Topics  . Smoking status: Never Smoker  . Smokeless tobacco: Not on file  . Alcohol use No     Allergies   Flagyl [metronidazole]   Review of Systems Review of Systems  Constitutional: Negative for activity change, chills, diaphoresis, fatigue and fever.  HENT: Negative for congestion and rhinorrhea.   Eyes: Negative for visual disturbance.  Respiratory: Negative for cough, chest tightness, shortness of breath and stridor.   Cardiovascular: Negative for chest pain, palpitations  and leg swelling.  Gastrointestinal: Negative for abdominal distention, abdominal pain, constipation, diarrhea, nausea and vomiting.  Genitourinary: Negative for difficulty urinating, dysuria, flank pain, frequency, hematuria, menstrual problem, pelvic pain, vaginal bleeding and vaginal discharge.  Musculoskeletal: Negative for back pain, neck pain and neck stiffness.  Skin: Positive for wound. Negative for rash.  Neurological: Negative for dizziness, weakness, light-headedness, numbness and headaches.  Psychiatric/Behavioral: Negative for agitation and confusion.  All other systems reviewed and are  negative.    Physical Exam Updated Vital Signs BP 106/60 (BP Location: Right Arm)   Pulse (!) 51   Temp 97.5 F (36.4 C) (Axillary)   Resp 18   Ht 5\' 4"  (1.626 m)   Wt 158 lb (71.7 kg)   LMP 12/31/2016   SpO2 98%   BMI 27.12 kg/m   Physical Exam  Constitutional: She is oriented to person, place, and time. She appears well-developed and well-nourished. No distress.  HENT:  Head: Head is with laceration.    Left Ear: External ear normal.  Nose: Nose normal.  Mouth/Throat: Oropharynx is clear and moist. No oropharyngeal exudate.  Eyes: Conjunctivae and EOM are normal. Pupils are equal, round, and reactive to light.  Full EOM and normal vision. No diplopia.   Neck: Normal range of motion. Neck supple.  Cardiovascular: Regular rhythm.  Bradycardia present.   No murmur heard. Intermittent bradycardia and hypotension upon arrival. Pallor present upon arrival.     Pulmonary/Chest: No stridor. No respiratory distress. She has no wheezes. She exhibits no tenderness.  Abdominal: She exhibits no distension. There is no tenderness. There is no rebound.  Musculoskeletal: She exhibits tenderness.       Left wrist: She exhibits laceration.       Arms: Neurological: She is alert and oriented to person, place, and time. She has normal reflexes. She exhibits normal muscle tone. Coordination normal.  Skin: Skin is warm. Capillary refill takes less than 2 seconds. She is not diaphoretic. No erythema.  Psychiatric: She has a normal mood and affect.  Nursing note and vitals reviewed.    ED Treatments / Results  Labs (all labs ordered are listed, but only abnormal results are displayed) Labs Reviewed  COMPREHENSIVE METABOLIC PANEL - Abnormal; Notable for the following:       Result Value   Glucose, Bld 108 (*)    Creatinine, Ser 1.06 (*)    Calcium 8.7 (*)    Total Protein 6.0 (*)    Albumin 3.4 (*)    Alkaline Phosphatase 31 (*)    GFR calc non Af Amer 60 (*)    All other  components within normal limits  CBC - Abnormal; Notable for the following:    WBC 11.2 (*)    RBC 3.82 (*)    Hemoglobin 10.8 (*)    HCT 34.9 (*)    All other components within normal limits  I-STAT CHEM 8, ED - Abnormal; Notable for the following:    BUN 22 (*)    Creatinine, Ser 1.10 (*)    Glucose, Bld 106 (*)    Calcium, Ion 1.11 (*)    Hemoglobin 11.9 (*)    HCT 35.0 (*)    All other components within normal limits  CDS SEROLOGY  ETHANOL  PROTIME-INR  URINALYSIS, ROUTINE W REFLEX MICROSCOPIC  HIV ANTIBODY (ROUTINE TESTING)  CBC  BASIC METABOLIC PANEL  I-STAT CG4 LACTIC ACID, ED  TYPE AND SCREEN  PREPARE FRESH FROZEN PLASMA  ABO/RH    EKG  EKG  Interpretation None       Radiology Dg Forearm Left  Result Date: 12/31/2016 CLINICAL DATA:  Dog bite today.  Soft tissue injury. EXAM: LEFT FOREARM - 2 VIEW COMPARISON:  None. FINDINGS: No proximal abnormality. Soft tissue injury in the region of the distal forearm and wrist. Apparent cortical puncture fracture of the volar distal radius. Question small radiopaque abnormality which could be a bone fragment or foreign object. IMPRESSION: Apparent cortical puncture fracture of the volar distal radius. Question small loose body or bone fragment. Electronically Signed   By: Paulina Fusi M.D.   On: 12/31/2016 09:40   Dg Wrist Complete Left  Result Date: 12/31/2016 CLINICAL DATA:  Dog bite injury today. EXAM: LEFT WRIST - COMPLETE 3+ VIEW COMPARISON:  Forearm same day. FINDINGS: Antionette Char are cortical puncture fracture of the distal radial metaphysis. Small radiopaque structure in the soft tissues could be bone fragment or foreign object. I do not see any ear in any of the joints, but there is obvious air in the extra synovial soft tissues. IMPRESSION: Puncture fracture of the volar distal radial metaphysis. Question bone fragment or foreign object nearby. Electronically Signed   By: Paulina Fusi M.D.   On: 12/31/2016 09:42   Ct Head Wo  Contrast  Result Date: 12/31/2016 CLINICAL DATA:  Dog bite to the right side of the face with severe right ear laceration. EXAM: CT HEAD WITHOUT CONTRAST CT MAXILLOFACIAL WITHOUT CONTRAST CT CERVICAL SPINE WITHOUT CONTRAST TECHNIQUE: Contiguous axial images were obtained from the base of the skull through the vertex without intravenous contrast. Multidetector CT imaging of the maxillofacial structures was performed. Multiplanar CT image reconstructions were also generated. A small metallic BB was placed on the right temple in order to reliably differentiate right from left. Multidetector CT imaging of the cervical spine was performed without intravenous contrast. Multiplanar CT image reconstructions were also generated. COMPARISON:  None. FINDINGS: CT HEAD FINDINGS Brain: No evidence of acute infarction, hemorrhage, hydrocephalus, extra-axial collection or mass lesion/mass effect. Vascular: No hyperdense vessel or unexpected calcification. Skull: Normal. Negative for fracture or focal lesion. Other: Right-sided facial hematoma. CT MAXILLOFACIAL FINDINGS Osseous: No fracture or mandibular dislocation. No destructive process. Orbits: Negative. No traumatic or inflammatory finding. Right periorbital hematoma. Sinuses: Clear. Soft tissues: Hematoma to the right sided of the face with small amounts of soft tissue emphysema tracking into the right perinasal soft tissues. Severe laceration of the right ear. Normal appearance of the right parotid gland. CT CERVICAL SPINE FINDINGS Alignment: Mild straightening of the cervical lordosis. Skull base and vertebrae: No acute fracture. No primary bone lesion or focal pathologic process. Soft tissues and spinal canal: No prevertebral fluid or swelling. No visible canal hematoma. Disc levels:  No significant degenerative disc disease. Upper chest: Negative. Other: None. IMPRESSION: No evidence of acute intracranial injury. No evidence of acute injury to the cervical spine. No  evidence of facial fractures. Extensive soft tissue trauma to the right side of the face, including right preseptal hematoma. Soft tissue emphysema tracks within the soft tissues of the right face and into the nasal soft tissues. Severe right ear laceration.  Right parotid gland is intact. Electronically Signed   By: Ted Mcalpine M.D.   On: 12/31/2016 09:25   Ct Cervical Spine Wo Contrast  Result Date: 12/31/2016 CLINICAL DATA:  Dog bite to the right side of the face with severe right ear laceration. EXAM: CT HEAD WITHOUT CONTRAST CT MAXILLOFACIAL WITHOUT CONTRAST CT CERVICAL SPINE WITHOUT CONTRAST TECHNIQUE: Contiguous  axial images were obtained from the base of the skull through the vertex without intravenous contrast. Multidetector CT imaging of the maxillofacial structures was performed. Multiplanar CT image reconstructions were also generated. A small metallic BB was placed on the right temple in order to reliably differentiate right from left. Multidetector CT imaging of the cervical spine was performed without intravenous contrast. Multiplanar CT image reconstructions were also generated. COMPARISON:  None. FINDINGS: CT HEAD FINDINGS Brain: No evidence of acute infarction, hemorrhage, hydrocephalus, extra-axial collection or mass lesion/mass effect. Vascular: No hyperdense vessel or unexpected calcification. Skull: Normal. Negative for fracture or focal lesion. Other: Right-sided facial hematoma. CT MAXILLOFACIAL FINDINGS Osseous: No fracture or mandibular dislocation. No destructive process. Orbits: Negative. No traumatic or inflammatory finding. Right periorbital hematoma. Sinuses: Clear. Soft tissues: Hematoma to the right sided of the face with small amounts of soft tissue emphysema tracking into the right perinasal soft tissues. Severe laceration of the right ear. Normal appearance of the right parotid gland. CT CERVICAL SPINE FINDINGS Alignment: Mild straightening of the cervical lordosis.  Skull base and vertebrae: No acute fracture. No primary bone lesion or focal pathologic process. Soft tissues and spinal canal: No prevertebral fluid or swelling. No visible canal hematoma. Disc levels:  No significant degenerative disc disease. Upper chest: Negative. Other: None. IMPRESSION: No evidence of acute intracranial injury. No evidence of acute injury to the cervical spine. No evidence of facial fractures. Extensive soft tissue trauma to the right side of the face, including right preseptal hematoma. Soft tissue emphysema tracks within the soft tissues of the right face and into the nasal soft tissues. Severe right ear laceration.  Right parotid gland is intact. Electronically Signed   By: Ted Mcalpine M.D.   On: 12/31/2016 09:25   Dg Chest Portable 1 View  Result Date: 12/31/2016 CLINICAL DATA:  Dull bite to the left ear.  No chest trauma EXAM: PORTABLE CHEST 1 VIEW COMPARISON:  Chest x-ray of January 11, 2014 FINDINGS: The lungs are slightly less well inflated today. The lung markings are coarse at both bases but this is not new and is accentuated by mild hypoinflation. The heart and pulmonary vascularity are normal. The mediastinum is normal in width. The bony thorax is unremarkable. IMPRESSION: There is no acute cardiopulmonary abnormality. Electronically Signed   By: David  [place] M.D.   On: 12/31/2016 08:54   Ct Maxillofacial Wo Contrast  Result Date: 12/31/2016 CLINICAL DATA:  Dog bite to the right side of the face with severe right ear laceration. EXAM: CT HEAD WITHOUT CONTRAST CT MAXILLOFACIAL WITHOUT CONTRAST CT CERVICAL SPINE WITHOUT CONTRAST TECHNIQUE: Contiguous axial images were obtained from the base of the skull through the vertex without intravenous contrast. Multidetector CT imaging of the maxillofacial structures was performed. Multiplanar CT image reconstructions were also generated. A small metallic BB was placed on the right temple in order to reliably differentiate right  from left. Multidetector CT imaging of the cervical spine was performed without intravenous contrast. Multiplanar CT image reconstructions were also generated. COMPARISON:  None. FINDINGS: CT HEAD FINDINGS Brain: No evidence of acute infarction, hemorrhage, hydrocephalus, extra-axial collection or mass lesion/mass effect. Vascular: No hyperdense vessel or unexpected calcification. Skull: Normal. Negative for fracture or focal lesion. Other: Right-sided facial hematoma. CT MAXILLOFACIAL FINDINGS Osseous: No fracture or mandibular dislocation. No destructive process. Orbits: Negative. No traumatic or inflammatory finding. Right periorbital hematoma. Sinuses: Clear. Soft tissues: Hematoma to the right sided of the face with small amounts of soft tissue emphysema  tracking into the right perinasal soft tissues. Severe laceration of the right ear. Normal appearance of the right parotid gland. CT CERVICAL SPINE FINDINGS Alignment: Mild straightening of the cervical lordosis. Skull base and vertebrae: No acute fracture. No primary bone lesion or focal pathologic process. Soft tissues and spinal canal: No prevertebral fluid or swelling. No visible canal hematoma. Disc levels:  No significant degenerative disc disease. Upper chest: Negative. Other: None. IMPRESSION: No evidence of acute intracranial injury. No evidence of acute injury to the cervical spine. No evidence of facial fractures. Extensive soft tissue trauma to the right side of the face, including right preseptal hematoma. Soft tissue emphysema tracks within the soft tissues of the right face and into the nasal soft tissues. Severe right ear laceration.  Right parotid gland is intact. Electronically Signed   By: Ted Mcalpine M.D.   On: 12/31/2016 09:25    Procedures Procedures (including critical care time)  CRITICAL CARE Performed by: Canary Brim Tegeler Total critical care time: 35 minutes Critical care time was exclusive of separately billable  procedures and treating other patients. Critical care was necessary to treat or prevent imminent or life-threatening deterioration. Critical care was time spent personally by me on the following activities: development of treatment plan with patient and/or surrogate as well as nursing, discussions with consultants, evaluation of patient's response to treatment, examination of patient, obtaining history from patient or surrogate, ordering and performing treatments and interventions, ordering and review of laboratory studies, ordering and review of radiographic studies, pulse oximetry and re-evaluation of patient's condition.   Medications Ordered in ED Medications  fentaNYL (SUBLIMAZE) 100 MCG/2ML injection (not administered)  fentaNYL (SUBLIMAZE) injection 50 mcg (not administered)  ceFAZolin (ANCEF) IVPB 2g/100 mL premix (not administered)  Tdap (BOOSTRIX) injection 0.5 mL (not administered)  sodium chloride 0.9 % bolus 1,000 mL (not administered)  sodium chloride 0.9 % bolus 1,000 mL (not administered)     Initial Impression / Assessment and Plan / ED Course  I have reviewed the triage vital signs and the nursing notes.  Pertinent labs & imaging results that were available during my care of the patient were reviewed by me and considered in my medical decision making (see chart for details).  Clinical Course as of Dec 31 2004  Thu Dec 31, 2016  0841 DG Forearm Left [CT]    Clinical Course User Index [CT] Heide Scales, MD     Laura [name] is a 51 y.o. female With no significant past medical history who presents with a dog attack. Shortly before arrival, patient was attacked by her pitbull inside her home. Patient has injuries to the face, right ear, posterior scalp, And left forearm. Patient is right-handed. Patient managed by EMS and brought in for evaluation.  On arrival, patient was hypotensive with a blood pressure 80s systolic and was bradycardic in the 30s. Level I trauma  was quickly activated.   Airway was intact, breath sounds were equal bilaterally, and blood pressure improved with fluids.  Portable chest x-ray obtained and Revealed no pneumothorax or cardiopulmonary abnormality.  Patient given Tdap and Ancef.  Patient had imaging showing puncture fracture the left radius from dogbite. Orthopedics will see the patient. For facial injuries and scalp injuries maxillofacial team will take the patient to the OR. Patient will be admitted to trauma for further management.  Patient had improvement in vital signs with fluids.  Patient taken to operating room in Stable condition.    Final Clinical Impressions(s) / ED  Diagnoses   Final diagnoses:  Dog bite  Dog bite  Dog bite     Clinical Impression: 1. Dog bite     Disposition: Admit to Trauma after OR with maxillofacial surgery and orthopedics    Heide Scaleshristopher J Tegeler, MD 01/01/17 2053

## 2016-12-31 NOTE — ED Notes (Signed)
Pt belongings (black jacket, cell phone, blue [employer] wallet) placed in belonging bag, labeled and taken with pt to short stay

## 2016-12-31 NOTE — Consult Note (Signed)
Reason for Consult:Dog bite Referring Physician: Shiane [name] is an 51 y.o. female.  HPI: Laura [name], biting [name] head, face, and left wrist. She was brought by EMS and upgraded to a level 1 activation after a hypotensive episode. She was resuscitated in the ED. ENT was consulted for [name] ear avulsion. Orthopedics was consulted for [name] extremity bites and a distal radius fracture. She is left hand dominant.  Past Medical History:  Diagnosis Date  . Allergy   . Anxiety   . Depression     Past Surgical History:  Procedure Laterality Date  . BUNIONECTOMY  2004   R foot    Family History  Problem Relation Age of Onset  . Stroke Mother   . Scoliosis Mother   . Gallbladder disease Mother   . Anxiety disorder Mother   . Depression Mother   . Heart disease Maternal Grandmother   . Stroke Maternal Grandmother   . Cancer Paternal Grandmother     breast  . Heart disease Paternal Grandfather   . GER disease Daughter   . Gallbladder disease Daughter     Social History:  reports that she has never smoked. She has never used smokeless tobacco. She reports that she does not drink alcohol or use drugs.  Allergies:  Allergies  Allergen Reactions  . Flagyl [Metronidazole] Rash    Medications: I have reviewed the patient's current medications.  Results for orders placed or performed during the hospital encounter of 12/31/16 (from the past 48 hour(s))  Prepare fresh frozen plasma     Status: None   Collection Time: 12/31/16  8:10 AM  Result Value Ref Range   ISSUE DATE / TIME 253664403474    Blood Product Unit Number [id number]    PRODUCT CODE E2457V00    Unit Type and Rh 0600    Blood Product Expiration Date 295188416606    ISSUE DATE / TIME 301601093235    Blood Product Unit Number [id number]    PRODUCT CODE E2457V00    Unit Type and Rh 6200    Blood Product Expiration Date 237628315176   Type and screen      Status: None   Collection Time: 12/31/16  8:19 AM  Result Value Ref Range   ISSUE DATE / TIME 160737106269    Blood Product Unit Number [id number]    PRODUCT CODE E0336V00    Unit Type and Rh 9500    Blood Product Expiration Date 381829937169    ISSUE DATE / TIME 678938101751    Blood Product Unit Number [id number]    PRODUCT CODE N3614E31    Unit Type and Rh 9500    Blood Product Expiration Date 540086761950   CDS serology     Status: None   Collection Time: 12/31/16  8:19 AM  Result Value Ref Range   CDS serology specimen STAT   Comprehensive metabolic panel     Status: Abnormal   Collection Time: 12/31/16  8:19 AM  Result Value Ref Range   Sodium 138 135 - 145 mmol/L   Potassium 3.7 3.5 - 5.1 mmol/L   Chloride 108 101 - 111 mmol/L   CO2 23 22 - 32 mmol/L   Glucose, Bld 108 (H) 65 - 99 mg/dL   BUN 18 6 - 20 mg/dL   Creatinine, Ser 1.06 (H) 0.44 - 1.00 mg/dL   Calcium 8.7 (L) 8.9 - 10.3 mg/dL   Total Protein 6.0 (L) 6.5 -  8.1 g/dL   Albumin 3.4 (L) 3.5 - 5.0 g/dL   AST 20 15 - 41 U/L   ALT 14 14 - 54 U/L   Alkaline Phosphatase 31 (L) 38 - 126 U/L   Total Bilirubin 0.5 0.3 - 1.2 mg/dL   GFR calc non Af Amer 60 (L) >60 mL/min   GFR calc Af Amer >60 >60 mL/min    Comment: (NOTE) The eGFR has been calculated using the CKD EPI equation. This calculation has not been validated in all clinical situations. eGFR's persistently <60 mL/min signify possible Chronic Kidney Disease.    Anion gap 7 5 - 15  CBC     Status: Abnormal   Collection Time: 12/31/16  8:19 AM  Result Value Ref Range   WBC 11.2 (H) 4.0 - 10.5 K/uL   RBC 3.82 (L) 3.87 - 5.11 MIL/uL   Hemoglobin 10.8 (L) 12.0 - 15.0 g/dL   HCT 34.9 (L) 36.0 - 46.0 %   MCV 91.4 78.0 - 100.0 fL   MCH 28.3 26.0 - 34.0 pg   MCHC 30.9 30.0 - 36.0 g/dL   RDW 14.1 11.5 - 15.5 %   Platelets 296 150 - 400 K/uL  Ethanol     Status: None   Collection Time: 12/31/16  8:19 AM  Result Value Ref Range   Alcohol, Ethyl (B) <5  <5 mg/dL    Comment:        LOWEST DETECTABLE LIMIT FOR SERUM ALCOHOL IS 5 mg/dL FOR MEDICAL PURPOSES ONLY   Protime-INR     Status: None   Collection Time: 12/31/16  8:19 AM  Result Value Ref Range   Prothrombin Time 13.1 11.4 - 15.2 seconds   INR 0.99   ABO/Rh     Status: None (Preliminary result)   Collection Time: 12/31/16  8:19 AM  Result Value Ref Range   ABO/RH(D) O POS   I-Stat Chem 8, ED     Status: Abnormal   Collection Time: 12/31/16  8:36 AM  Result Value Ref Range   Sodium 142 135 - 145 mmol/L   Potassium 3.7 3.5 - 5.1 mmol/L   Chloride 108 101 - 111 mmol/L   BUN 22 (H) 6 - 20 mg/dL   Creatinine, Ser 1.10 (H) 0.44 - 1.00 mg/dL   Glucose, Bld 106 (H) 65 - 99 mg/dL   Calcium, Ion 1.11 (L) 1.15 - 1.40 mmol/L   TCO2 23 0 - 100 mmol/L   Hemoglobin 11.9 (L) 12.0 - 15.0 g/dL   HCT 35.0 (L) 36.0 - 46.0 %  I-Stat CG4 Lactic Acid, ED     Status: None   Collection Time: 12/31/16  8:36 AM  Result Value Ref Range   Lactic Acid, Venous 1.13 0.5 - 1.9 mmol/L    Dg Forearm Left  Result Date: 12/31/2016 CLINICAL DATA:  Dog bite today.  Soft tissue injury. EXAM: LEFT FOREARM - 2 VIEW COMPARISON:  None. FINDINGS: No proximal abnormality. Soft tissue injury in the region of the distal forearm and wrist. Apparent cortical puncture fracture of the volar distal radius. Question small radiopaque abnormality which could be a bone fragment or foreign object. IMPRESSION: Apparent cortical puncture fracture of the volar distal radius. Question small loose body or bone fragment. Electronically Signed   By: Nelson Chimes M.D.   On: 12/31/2016 09:40   Dg Wrist Complete Left  Result Date: 12/31/2016 CLINICAL DATA:  Dog bite injury today. EXAM: LEFT WRIST - COMPLETE 3+ VIEW COMPARISON:  Forearm same day.  FINDINGS: Diona Foley are cortical puncture fracture of the distal radial metaphysis. Small radiopaque structure in the soft tissues could be bone fragment or foreign object. I do not see any ear in any  of the joints, but there is obvious air in the extra synovial soft tissues. IMPRESSION: Puncture fracture of the volar distal radial metaphysis. Question bone fragment or foreign object nearby. Electronically Signed   By: Nelson Chimes M.D.   On: 12/31/2016 09:42   Ct Head Wo Contrast  Result Date: 12/31/2016 CLINICAL DATA:  Dog bite to the right side of the face with severe right ear laceration. EXAM: CT HEAD WITHOUT CONTRAST CT MAXILLOFACIAL WITHOUT CONTRAST CT CERVICAL SPINE WITHOUT CONTRAST TECHNIQUE: Contiguous axial images were obtained from the base of the skull through the vertex without intravenous contrast. Multidetector CT imaging of the maxillofacial structures was performed. Multiplanar CT image reconstructions were also generated. A small metallic BB was placed on the right temple in order to reliably differentiate right from left. Multidetector CT imaging of the cervical spine was performed without intravenous contrast. Multiplanar CT image reconstructions were also generated. COMPARISON:  None. FINDINGS: CT HEAD FINDINGS Brain: No evidence of acute infarction, hemorrhage, hydrocephalus, extra-axial collection or mass lesion/mass effect. Vascular: No hyperdense vessel or unexpected calcification. Skull: Normal. Negative for fracture or focal lesion. Other: Right-sided facial hematoma. CT MAXILLOFACIAL FINDINGS Osseous: No fracture or mandibular dislocation. No destructive process. Orbits: Negative. No traumatic or inflammatory finding. Right periorbital hematoma. Sinuses: Clear. Soft tissues: Hematoma to the right sided of the face with small amounts of soft tissue emphysema tracking into the right perinasal soft tissues. Severe laceration of the right ear. Normal appearance of the right parotid gland. CT CERVICAL SPINE FINDINGS Alignment: Mild straightening of the cervical lordosis. Skull base and vertebrae: No acute fracture. No primary bone lesion or focal pathologic process. Soft tissues and  spinal canal: No prevertebral fluid or swelling. No visible canal hematoma. Disc levels:  No significant degenerative disc disease. Upper chest: Negative. Other: None. IMPRESSION: No evidence of acute intracranial injury. No evidence of acute injury to the cervical spine. No evidence of facial fractures. Extensive soft tissue trauma to the right side of the face, including right preseptal hematoma. Soft tissue emphysema tracks within the soft tissues of the right face and into the nasal soft tissues. Severe right ear laceration.  Right parotid gland is intact. Electronically Signed   By: Fidela Salisbury M.D.   On: 12/31/2016 09:25   Ct Cervical Spine Wo Contrast  Result Date: 12/31/2016 CLINICAL DATA:  Dog bite to the right side of the face with severe right ear laceration. EXAM: CT HEAD WITHOUT CONTRAST CT MAXILLOFACIAL WITHOUT CONTRAST CT CERVICAL SPINE WITHOUT CONTRAST TECHNIQUE: Contiguous axial images were obtained from the base of the skull through the vertex without intravenous contrast. Multidetector CT imaging of the maxillofacial structures was performed. Multiplanar CT image reconstructions were also generated. A small metallic BB was placed on the right temple in order to reliably differentiate right from left. Multidetector CT imaging of the cervical spine was performed without intravenous contrast. Multiplanar CT image reconstructions were also generated. COMPARISON:  None. FINDINGS: CT HEAD FINDINGS Brain: No evidence of acute infarction, hemorrhage, hydrocephalus, extra-axial collection or mass lesion/mass effect. Vascular: No hyperdense vessel or unexpected calcification. Skull: Normal. Negative for fracture or focal lesion. Other: Right-sided facial hematoma. CT MAXILLOFACIAL FINDINGS Osseous: No fracture or mandibular dislocation. No destructive process. Orbits: Negative. No traumatic or inflammatory finding. Right periorbital hematoma. Sinuses: Clear.  Soft tissues: Hematoma to the right  sided of the face with small amounts of soft tissue emphysema tracking into the right perinasal soft tissues. Severe laceration of the right ear. Normal appearance of the right parotid gland. CT CERVICAL SPINE FINDINGS Alignment: Mild straightening of the cervical lordosis. Skull base and vertebrae: No acute fracture. No primary bone lesion or focal pathologic process. Soft tissues and spinal canal: No prevertebral fluid or swelling. No visible canal hematoma. Disc levels:  No significant degenerative disc disease. Upper chest: Negative. Other: None. IMPRESSION: No evidence of acute intracranial injury. No evidence of acute injury to the cervical spine. No evidence of facial fractures. Extensive soft tissue trauma to the right side of the face, including right preseptal hematoma. Soft tissue emphysema tracks within the soft tissues of the right face and into the nasal soft tissues. Severe right ear laceration.  Right parotid gland is intact. Electronically Signed   By: Fidela Salisbury M.D.   On: 12/31/2016 09:25   Dg Chest Portable 1 View  Result Date: 12/31/2016 CLINICAL DATA:  Dull bite to the left ear.  No chest trauma EXAM: PORTABLE CHEST 1 VIEW COMPARISON:  Chest x-ray of January 11, 2014 FINDINGS: The lungs are slightly less well inflated today. The lung markings are coarse at both bases but this is not new and is accentuated by mild hypoinflation. The heart and pulmonary vascularity are normal. The mediastinum is normal in width. The bony thorax is unremarkable. IMPRESSION: There is no acute cardiopulmonary abnormality. Electronically Signed   By: David  Martinique M.D.   On: 12/31/2016 08:54   Ct Maxillofacial Wo Contrast  Result Date: 12/31/2016 CLINICAL DATA:  Dog bite to the right side of the face with severe right ear laceration. EXAM: CT HEAD WITHOUT CONTRAST CT MAXILLOFACIAL WITHOUT CONTRAST CT CERVICAL SPINE WITHOUT CONTRAST TECHNIQUE: Contiguous axial images were obtained from the base of the  skull through the vertex without intravenous contrast. Multidetector CT imaging of the maxillofacial structures was performed. Multiplanar CT image reconstructions were also generated. A small metallic BB was placed on the right temple in order to reliably differentiate right from left. Multidetector CT imaging of the cervical spine was performed without intravenous contrast. Multiplanar CT image reconstructions were also generated. COMPARISON:  None. FINDINGS: CT HEAD FINDINGS Brain: No evidence of acute infarction, hemorrhage, hydrocephalus, extra-axial collection or mass lesion/mass effect. Vascular: No hyperdense vessel or unexpected calcification. Skull: Normal. Negative for fracture or focal lesion. Other: Right-sided facial hematoma. CT MAXILLOFACIAL FINDINGS Osseous: No fracture or mandibular dislocation. No destructive process. Orbits: Negative. No traumatic or inflammatory finding. Right periorbital hematoma. Sinuses: Clear. Soft tissues: Hematoma to the right sided of the face with small amounts of soft tissue emphysema tracking into the right perinasal soft tissues. Severe laceration of the right ear. Normal appearance of the right parotid gland. CT CERVICAL SPINE FINDINGS Alignment: Mild straightening of the cervical lordosis. Skull base and vertebrae: No acute fracture. No primary bone lesion or focal pathologic process. Soft tissues and spinal canal: No prevertebral fluid or swelling. No visible canal hematoma. Disc levels:  No significant degenerative disc disease. Upper chest: Negative. Other: None. IMPRESSION: No evidence of acute intracranial injury. No evidence of acute injury to the cervical spine. No evidence of facial fractures. Extensive soft tissue trauma to the right side of the face, including right preseptal hematoma. Soft tissue emphysema tracks within the soft tissues of the right face and into the nasal soft tissues. Severe right ear laceration.  Right  parotid gland is intact.  Electronically Signed   By: Fidela Salisbury M.D.   On: 12/31/2016 09:25    Review of Systems  Constitutional: Negative for weight loss.  HENT: Positive for ear pain. Negative for ear discharge, hearing loss and tinnitus.   Eyes: Negative for blurred vision, double vision, photophobia and pain.  Respiratory: Negative for cough, sputum production and shortness of breath.   Cardiovascular: Negative for chest pain.  Gastrointestinal: Negative for abdominal pain, nausea and vomiting.  Genitourinary: Negative for dysuria, flank pain, frequency and urgency.  Musculoskeletal: Positive for joint pain and myalgias. Negative for back pain, falls and neck pain.  Neurological: Negative for dizziness, tingling, sensory change, focal weakness, loss of consciousness and headaches.  Endo/Heme/Allergies: Does not bruise/bleed easily.  Psychiatric/Behavioral: Negative for depression, memory loss and substance abuse. The patient is not nervous/anxious.    Blood pressure 114/73, pulse 66, temperature 97.6 F (36.4 C), temperature source Oral, resp. rate 15, height _0  (1.626 m), weight 71.7 kg (158 lb), last menstrual period 12/31/2016, SpO2 99 %. Physical Exam  Constitutional: She appears well-developed and well-nourished. She appears distressed. Nasal cannula in place.  HENT:  Head: Head is with laceration.  Left Ear: Left ear exhibits lacerations.  Eyes: Conjunctivae are normal. Right eye exhibits no discharge. Left eye exhibits no discharge. No scleral icterus.  Cardiovascular: Normal rate and regular rhythm.   Respiratory: No respiratory distress.  GI: Soft.  Musculoskeletal:       Left wrist: She exhibits tenderness, bony tenderness, swelling and laceration.       Arms: Lymphadenopathy:    She has no cervical adenopathy.  Neurological: She is alert.  Skin: Skin is warm and dry. She is not diaphoretic.  Psychiatric: [name] speech is normal. Thought content normal. [name] mood appears anxious. She  is agitated.    Assessment/Plan: Dog bites Open left distal radius fx/puncture wounds x3 -- Laura [name] to wash out wounds in OR. Will likely need a broader spectrum abx given dog bite with open fracture, recommend Unasyn or Zosyn x24h then conversion to oral for discharge home. Facial wounds/ear avulsion -- per Dr. Redmond Baseman  I have seen and examined the patient intra-operatively and agree with Laura [name] note.  Will wash out [name] left wrist and forearm dog bite wounds and treat [name] broken wrist with a velcro splint   12/31/2016, 11:01 AM

## 2016-12-31 NOTE — Brief Op Note (Signed)
12/31/2016  1:16 PM  PATIENT:  Ty HiltsLisa M Andon  51 y.o. female  PRE-OPERATIVE DIAGNOSIS: Right ear, facial, and scalp lacerations  POST-OPERATIVE DIAGNOSIS:  Right ear, facial, and scalp lacerations  PROCEDURE:  Procedure(s): CLOSURE OF FACIAL LACERATION (N/A) IRRIGATION AND DEBRIDEMENT WOUND (Left)  SURGEON:  Surgeon(s) and Role: Panel 1:    * Christia Readingwight Lindsey Hommel, MD - Primary  Panel 2:    * Kathryne Hitchhristopher Y Blackman, MD - Primary  PHYSICIAN ASSISTANT:   ASSISTANTS: none   ANESTHESIA:   general  EBL:  Total I/O In: 1000 [I.V.:1000] Out: 1260 [Urine:1160; Blood:100]  BLOOD ADMINISTERED:none  DRAINS: Penrose drain in the right post-auricular area   LOCAL MEDICATIONS USED:  NONE  SPECIMEN:  No Specimen  DISPOSITION OF SPECIMEN:  N/A  COUNTS:  YES  TOURNIQUET:  * No tourniquets in log *  DICTATION: .Other Dictation: Dictation Number [id number]  PLAN OF CARE: Admit for overnight observation  PATIENT DISPOSITION:  PACU - hemodynamically stable.   Delay start of Pharmacological VTE agent (>24hrs) due to surgical blood loss or risk of bleeding: yes

## 2016-12-31 NOTE — ED Notes (Signed)
Trauma End 

## 2017-01-01 ENCOUNTER — Encounter (HOSPITAL_COMMUNITY): Payer: Self-pay | Admitting: Otolaryngology

## 2017-01-01 LAB — BASIC METABOLIC PANEL
Anion gap: 6 (ref 5–15)
BUN: 15 mg/dL (ref 6–20)
CHLORIDE: 108 mmol/L (ref 101–111)
CO2: 23 mmol/L (ref 22–32)
CREATININE: 0.93 mg/dL (ref 0.44–1.00)
Calcium: 8.2 mg/dL — ABNORMAL LOW (ref 8.9–10.3)
GFR calc Af Amer: >60 mL/min (ref >60)
GFR calc non Af Amer: >60 mL/min (ref >60)
GLUCOSE: 133 mg/dL — AB (ref 65–99)
POTASSIUM: 3.9 mmol/L (ref 3.5–5.1)
SODIUM: 137 mmol/L (ref 135–145)

## 2017-01-01 LAB — CBC
HCT: 26.4 % — ABNORMAL LOW (ref 36.0–46.0)
HEMOGLOBIN: 8.3 g/dL — AB (ref 12.0–15.0)
MCH: 28.4 pg (ref 26.0–34.0)
MCHC: 31.4 g/dL (ref 30.0–36.0)
MCV: 90.4 fL (ref 78.0–100.0)
Platelets: 228 10*3/uL (ref 150–400)
RBC: 2.92 MIL/uL — AB (ref 3.87–5.11)
RDW: 14.1 % (ref 11.5–15.5)
WBC: 9.9 10*3/uL (ref 4.0–10.5)

## 2017-01-01 LAB — BLOOD PRODUCT ORDER (VERBAL) VERIFICATION

## 2017-01-01 LAB — HIV ANTIBODY (ROUTINE TESTING W REFLEX): HIV SCREEN 4TH GENERATION: NONREACTIVE

## 2017-01-01 MED ORDER — HYDROCODONE-ACETAMINOPHEN 5-325 MG PO TABS: 1.0000 | ORAL_TABLET | ORAL | 0 refills | Status: DC | PRN

## 2017-01-01 MED ORDER — AMOXICILLIN 500 MG PO CAPS
500.0000 mg | ORAL_CAPSULE | Freq: Two times a day (BID) | ORAL | Status: DC
  Administered 2017-01-01 – 2017-01-02 (×3): 500 mg via ORAL
  Filled 2017-01-01 (×5): qty 1

## 2017-01-01 MED ORDER — ACETAMINOPHEN 325 MG PO TABS: 650.0000 mg | ORAL_TABLET | ORAL | Status: DC | PRN

## 2017-01-01 MED ORDER — AMOXICILLIN 500 MG PO CAPS: 500.0000 mg | ORAL_CAPSULE | Freq: Two times a day (BID) | ORAL | 0 refills | Status: AC

## 2017-01-01 MED ORDER — HYDROMORPHONE HCL 2 MG/ML IJ SOLN
0.5000 mg | INTRAMUSCULAR | Status: DC | PRN
  Filled 2017-01-01 (×2): qty 1

## 2017-01-01 MED ORDER — HYDROCODONE-ACETAMINOPHEN 5-325 MG PO TABS
1.0000 | ORAL_TABLET | ORAL | Status: DC | PRN
  Filled 2017-01-01: qty 1

## 2017-01-01 MED ORDER — PROMETHAZINE HCL 25 MG/ML IJ SOLN
12.5000 mg | Freq: Four times a day (QID) | INTRAMUSCULAR | Status: DC | PRN
  Administered 2017-01-01: 12.5 mg via INTRAVENOUS
  Filled 2017-01-01: qty 1

## 2017-01-01 MED ORDER — BACITRACIN ZINC 500 UNIT/GM EX OINT: TOPICAL_OINTMENT | Freq: Two times a day (BID) | CUTANEOUS | 0 refills | Status: DC

## 2017-01-01 NOTE — Progress Notes (Signed)
1 Day Post-Op  Subjective: Vomited, she reports it was either the oxycodone or "her nerves".    Objective: Vital signs in last 24 hours: Temp:  [97.5 F (36.4 C)-98.6 F (37 C)] 98.1 F (36.7 C) (02/23 0516) Pulse Rate:  [41-106] 60 (02/23 0257) Resp:  [12-99] 99 (02/23 0516) BP: (98-122)/(53-77) 98/57 (02/23 0516) SpO2:  [96 %-100 %] 97 % (02/23 0516)    Intake/Output from previous day: 02/22 0701 - 02/23 0700 In: 1950 [P.O.:300; I.V.:1650] Out: 2180 [Urine:1580; Emesis/NG output:500; Blood:100] Intake/Output this shift: Total I/O In: 997.9 [I.V.:997.9] Out: -   General appearance: alert and cooperative Ears: R ear viable, penrose in place, some edema Resp: clear to auscultation bilaterally Cardio: regular rate and rhythm GI: soft, non-tender; bowel sounds normal; no masses,  no organomegaly Extremities: splint L wrist  Lab Results: CBC   Recent Labs  12/31/16 0819 12/31/16 0836 01/01/17 0427  WBC 11.2*  --  9.9  HGB 10.8* 11.9* 8.3*  HCT 34.9* 35.0* 26.4*  PLT 296  --  228   BMET  Recent Labs  12/31/16 0819 12/31/16 0836 01/01/17 0427  NA 138 142 137  K 3.7 3.7 3.9  CL 108 108 108  CO2 23  --  23  GLUCOSE 108* 106* 133*  BUN 18 22* 15  CREATININE 1.06* 1.10* 0.93  CALCIUM 8.7*  --  8.2*   PT/INR  Recent Labs  12/31/16 0819  LABPROT 13.1  INR 0.99   ABG No results for input(s): PHART, HCO3 in the last 72 hours.  Invalid input(s): PCO2, PO2  Studies/Results: Dg Forearm Left  Result Date: 12/31/2016 CLINICAL DATA:  Dog bite today.  Soft tissue injury. EXAM: LEFT FOREARM - 2 VIEW COMPARISON:  None. FINDINGS: No proximal abnormality. Soft tissue injury in the region of the distal forearm and wrist. Apparent cortical puncture fracture of the volar distal radius. Question small radiopaque abnormality which could be a bone fragment or foreign object. IMPRESSION: Apparent cortical puncture fracture of the volar distal radius. Question small loose  body or bone fragment. Electronically Signed   By: Paulina FusiMark  Shogry M.D.   On: 12/31/2016 09:40   Dg Wrist Complete Left  Result Date: 12/31/2016 CLINICAL DATA:  Dog bite injury today. EXAM: LEFT WRIST - COMPLETE 3+ VIEW COMPARISON:  Forearm same day. FINDINGS: Antionette CharBull are cortical puncture fracture of the distal radial metaphysis. Small radiopaque structure in the soft tissues could be bone fragment or foreign object. I do not see any ear in any of the joints, but there is obvious air in the extra synovial soft tissues. IMPRESSION: Puncture fracture of the volar distal radial metaphysis. Question bone fragment or foreign object nearby. Electronically Signed   By: Paulina FusiMark  Shogry M.D.   On: 12/31/2016 09:42   Ct Head Wo Contrast  Result Date: 12/31/2016 CLINICAL DATA:  Dog bite to the right side of the face with severe right ear laceration. EXAM: CT HEAD WITHOUT CONTRAST CT MAXILLOFACIAL WITHOUT CONTRAST CT CERVICAL SPINE WITHOUT CONTRAST TECHNIQUE: Contiguous axial images were obtained from the base of the skull through the vertex without intravenous contrast. Multidetector CT imaging of the maxillofacial structures was performed. Multiplanar CT image reconstructions were also generated. A small metallic BB was placed on the right temple in order to reliably differentiate right from left. Multidetector CT imaging of the cervical spine was performed without intravenous contrast. Multiplanar CT image reconstructions were also generated. COMPARISON:  None. FINDINGS: CT HEAD FINDINGS Brain: No evidence of acute infarction,  hemorrhage, hydrocephalus, extra-axial collection or mass lesion/mass effect. Vascular: No hyperdense vessel or unexpected calcification. Skull: Normal. Negative for fracture or focal lesion. Other: Right-sided facial hematoma. CT MAXILLOFACIAL FINDINGS Osseous: No fracture or mandibular dislocation. No destructive process. Orbits: Negative. No traumatic or inflammatory finding. Right periorbital  hematoma. Sinuses: Clear. Soft tissues: Hematoma to the right sided of the face with small amounts of soft tissue emphysema tracking into the right perinasal soft tissues. Severe laceration of the right ear. Normal appearance of the right parotid gland. CT CERVICAL SPINE FINDINGS Alignment: Mild straightening of the cervical lordosis. Skull base and vertebrae: No acute fracture. No primary bone lesion or focal pathologic process. Soft tissues and spinal canal: No prevertebral fluid or swelling. No visible canal hematoma. Disc levels:  No significant degenerative disc disease. Upper chest: Negative. Other: None. IMPRESSION: No evidence of acute intracranial injury. No evidence of acute injury to the cervical spine. No evidence of facial fractures. Extensive soft tissue trauma to the right side of the face, including right preseptal hematoma. Soft tissue emphysema tracks within the soft tissues of the right face and into the nasal soft tissues. Severe right ear laceration.  Right parotid gland is intact. Electronically Signed   By: Ted Mcalpine M.D.   On: 12/31/2016 09:25   Ct Cervical Spine Wo Contrast  Result Date: 12/31/2016 CLINICAL DATA:  Dog bite to the right side of the face with severe right ear laceration. EXAM: CT HEAD WITHOUT CONTRAST CT MAXILLOFACIAL WITHOUT CONTRAST CT CERVICAL SPINE WITHOUT CONTRAST TECHNIQUE: Contiguous axial images were obtained from the base of the skull through the vertex without intravenous contrast. Multidetector CT imaging of the maxillofacial structures was performed. Multiplanar CT image reconstructions were also generated. A small metallic BB was placed on the right temple in order to reliably differentiate right from left. Multidetector CT imaging of the cervical spine was performed without intravenous contrast. Multiplanar CT image reconstructions were also generated. COMPARISON:  None. FINDINGS: CT HEAD FINDINGS Brain: No evidence of acute infarction, hemorrhage,  hydrocephalus, extra-axial collection or mass lesion/mass effect. Vascular: No hyperdense vessel or unexpected calcification. Skull: Normal. Negative for fracture or focal lesion. Other: Right-sided facial hematoma. CT MAXILLOFACIAL FINDINGS Osseous: No fracture or mandibular dislocation. No destructive process. Orbits: Negative. No traumatic or inflammatory finding. Right periorbital hematoma. Sinuses: Clear. Soft tissues: Hematoma to the right sided of the face with small amounts of soft tissue emphysema tracking into the right perinasal soft tissues. Severe laceration of the right ear. Normal appearance of the right parotid gland. CT CERVICAL SPINE FINDINGS Alignment: Mild straightening of the cervical lordosis. Skull base and vertebrae: No acute fracture. No primary bone lesion or focal pathologic process. Soft tissues and spinal canal: No prevertebral fluid or swelling. No visible canal hematoma. Disc levels:  No significant degenerative disc disease. Upper chest: Negative. Other: None. IMPRESSION: No evidence of acute intracranial injury. No evidence of acute injury to the cervical spine. No evidence of facial fractures. Extensive soft tissue trauma to the right side of the face, including right preseptal hematoma. Soft tissue emphysema tracks within the soft tissues of the right face and into the nasal soft tissues. Severe right ear laceration.  Right parotid gland is intact. Electronically Signed   By: Ted Mcalpine M.D.   On: 12/31/2016 09:25   Dg Chest Portable 1 View  Result Date: 12/31/2016 CLINICAL DATA:  Dull bite to the left ear.  No chest trauma EXAM: PORTABLE CHEST 1 VIEW COMPARISON:  Chest x-ray of  January 11, 2014 FINDINGS: The lungs are slightly less well inflated today. The lung markings are coarse at both bases but this is not new and is accentuated by mild hypoinflation. The heart and pulmonary vascularity are normal. The mediastinum is normal in width. The bony thorax is unremarkable.  IMPRESSION: There is no acute cardiopulmonary abnormality. Electronically Signed   By: David  [place] M.D.   On: 12/31/2016 08:54   Ct Maxillofacial Wo Contrast  Result Date: 12/31/2016 CLINICAL DATA:  Dog bite to the right side of the face with severe right ear laceration. EXAM: CT HEAD WITHOUT CONTRAST CT MAXILLOFACIAL WITHOUT CONTRAST CT CERVICAL SPINE WITHOUT CONTRAST TECHNIQUE: Contiguous axial images were obtained from the base of the skull through the vertex without intravenous contrast. Multidetector CT imaging of the maxillofacial structures was performed. Multiplanar CT image reconstructions were also generated. A small metallic BB was placed on the right temple in order to reliably differentiate right from left. Multidetector CT imaging of the cervical spine was performed without intravenous contrast. Multiplanar CT image reconstructions were also generated. COMPARISON:  None. FINDINGS: CT HEAD FINDINGS Brain: No evidence of acute infarction, hemorrhage, hydrocephalus, extra-axial collection or mass lesion/mass effect. Vascular: No hyperdense vessel or unexpected calcification. Skull: Normal. Negative for fracture or focal lesion. Other: Right-sided facial hematoma. CT MAXILLOFACIAL FINDINGS Osseous: No fracture or mandibular dislocation. No destructive process. Orbits: Negative. No traumatic or inflammatory finding. Right periorbital hematoma. Sinuses: Clear. Soft tissues: Hematoma to the right sided of the face with small amounts of soft tissue emphysema tracking into the right perinasal soft tissues. Severe laceration of the right ear. Normal appearance of the right parotid gland. CT CERVICAL SPINE FINDINGS Alignment: Mild straightening of the cervical lordosis. Skull base and vertebrae: No acute fracture. No primary bone lesion or focal pathologic process. Soft tissues and spinal canal: No prevertebral fluid or swelling. No visible canal hematoma. Disc levels:  No significant degenerative disc  disease. Upper chest: Negative. Other: None. IMPRESSION: No evidence of acute intracranial injury. No evidence of acute injury to the cervical spine. No evidence of facial fractures. Extensive soft tissue trauma to the right side of the face, including right preseptal hematoma. Soft tissue emphysema tracks within the soft tissues of the right face and into the nasal soft tissues. Severe right ear laceration.  Right parotid gland is intact. Electronically Signed   By: Ted Mcalpine M.D.   On: 12/31/2016 09:25    Anti-infectives: Anti-infectives    Start     Dose/Rate Route Frequency Ordered Stop   12/31/16 1115  piperacillin-tazobactam (ZOSYN) IVPB 3.375 g     3.375 g 100 mL/hr over 30 Minutes Intravenous To Surgery 12/31/16 1104 12/31/16 1109   12/31/16 0830  ceFAZolin (ANCEF) IVPB 2g/100 mL premix     2 g 200 mL/hr over 30 Minutes Intravenous  Once 12/31/16 0827 12/31/16 1610      Assessment/Plan: Dog attack R ear avulsion, scalp lac, facial lacs - S/P repair by Dr. Jenne Pane. F/U with him this week. L radius puncture FX - per Dr. Magnus Ivan, amoxicillin BID 14d. F/U with him 1 week. N/V - lytes OK, antiemetics, change to Norco, decrease IVF ABL anemia Dispo - hope for home in AM, dog was taken by animal control  LOS: 0 days    Violeta Gelinas, MD, MPH, FACS Trauma: [phone] General Surgery: [phone]  2/23/2018Patient ID: Laura [name], female   DOB: 07/08/1966, 51 y.o.   MRN: [id number]

## 2017-01-01 NOTE — Care Management Note (Signed)
Case Management Note  Patient Details  Name: Laura [name] MRN: [id number] Date of Birth: [birth year]  Subjective/Objective:    Pt admitted on 12/31/16 s/p dog bites to face, Rt ear, scalp and Lt wrist.  PTA, pt independent; lives alone.                   Action/Plan: Met with pt to discuss discharge plans.  Pt states she has adult children who can assist at discharge.  She admits to feeling emotional this morning; she is quite concerned about being able to wash her hair.  Assisted pt in putting hair up in a hair band.  OT in to see pt now.  Will follow for discharge needs.    Expected Discharge Date:                  Expected Discharge Plan:  Home/Self Care  In-House Referral:     Discharge planning Services  CM Consult  Post Acute Care Choice:    Choice offered to:     DME Arranged:    DME Agency:     HH Arranged:    HH Agency:     Status of Service:  In process, will continue to follow  If discussed at Long Length of Stay Meetings, dates discussed:    Additional Comments:  Reinaldo Raddle, RN, BSN  Trauma/Neuro ICU Case Manager [phone]

## 2017-01-01 NOTE — Evaluation (Addendum)
Occupational Therapy Evaluation Patient Details Name: Laura [name] MRN: [id number] DOB: Dec 10, 1965 Today's Date: 01/01/2017    History of Present Illness Pt admitted on 12/31/16 s/p dog bites to face, Rt ear, scalp and Lt wrist.     Clinical Impression   PTA, pt lived alone and was independent in ADLs and IADLs. Currently pt has limited movement in LUE (dominant) due to injury and splint. Educated pt on edema management, ROM exercises, one handed compensatory techniues for ADLs. Pt verbalized understanding throughout session. Pt participation was limited due to high nausea; pt vomitted twice during session. Will visit again to provided pt with tubing to increase participation in grooming and self feeding.     Follow Up Recommendations  No OT follow up;Supervision - Intermittent    Equipment Recommendations  None recommended by OT    Recommendations for Other Services       Precautions / Restrictions        Mobility Bed Mobility                  Transfers                      Balance                                            ADL Overall ADL's : Needs assistance/impaired Eating/Feeding: Cueing for compensatory techinques;Set up Eating/Feeding Details (indicate cue type and reason): benefit from tubing for utensil use Grooming: Set up;Cueing for compensatory techniques;Sitting   Upper Body Bathing: Minimal assistance;Sitting   Lower Body Bathing: Minimal assistance;Sit to/from stand   Upper Body Dressing : Minimal assistance;Sitting;Cueing for compensatory techniques Upper Body Dressing Details (indicate cue type and reason): Educated pt on compensatory strateigies Lower Body Dressing: Minimal assistance;Sit to/from stand;Cueing for compensatory techniques   Toilet Transfer: Supervision/safety;Ambulation   Toileting- Clothing Manipulation and Hygiene: Minimal assistance;Sit to/from stand;Cueing for compensatory techniques   Tub/  Shower Transfer: Min guard;Ambulation Tub/Shower Transfer Details (indicate cue type and reason): RN informed pt to sponge bath initially Functional mobility during ADLs:  (Did not assess in session dueto increased nausea/throwing up) General ADL Comments:  (Pt educated on one handed tehcniques for ADLs; very nausea)     Vision         Perception     Praxis      Pertinent Vitals/Pain Pain Assessment: Faces Faces Pain Scale: Hurts whole lot Pain Location: Right ear, side of face, and left hand Pain Descriptors / Indicators: Constant Pain Intervention(s): Monitored during session;Limited activity within patient's tolerance     Hand Dominance Left   Extremity/Trunk Assessment Upper Extremity Assessment Upper Extremity Assessment: LUE deficits/detail LUE Deficits / Details: Surgery due to dog bite; in splint and wrapped LUE: Unable to fully assess due to immobilization LUE Coordination: decreased fine motor   Lower Extremity Assessment Lower Extremity Assessment: Defer to PT evaluation       Communication Communication Communication: No difficulties   Cognition Arousal/Alertness: Awake/alert Behavior During Therapy: WFL for tasks assessed/performed Overall Cognitive Status: Within Functional Limits for tasks assessed                     General Comments       Exercises       Shoulder Instructions      Home Living Family/patient expects to be discharged to::  Private residence Living Arrangements: Alone Available Help at Discharge: Family Type of Home: House       Home Layout: One level                          Prior Functioning/Environment Level of Independence: Independent                 OT Problem List: Impaired UE functional use;Pain      OT Treatment/Interventions: DME and/or AE instruction;Patient/family education;Therapeutic activities;Self-care/ADL training    OT Goals(Current goals can be found in the care plan  section) Acute Rehab OT Goals Patient Stated Goal: Get back to normal OT Goal Formulation: With patient Time For Goal Achievement: 01/15/17 Potential to Achieve Goals: Good  OT Frequency: Min 2X/week   Barriers to D/C:            Co-evaluation              End of Session Nurse Communication: Mobility status;Other (comment) (Nausea)  Activity Tolerance:  (Treatment limited due to nausea/throwing up) Patient left: in bed;with call bell/phone within reach  OT Visit Diagnosis: Other (comment) (UE injury)                ADL either performed or assessed with clinical judgement  Time: [id number] OT Time Calculation (min): 36 min Charges:  OT General Charges $OT Visit: 1 Procedure OT Evaluation $OT Eval Moderate Complexity: 1 Procedure OT Treatments $Self Care/Home Management : 8-22 mins G-Codes:    OT G-codes **NOT FOR INPATIENT CLASS** Functional Assessment Tool Used: Clinical judgement Functional Limitation: Self care Self Care Current Status (E9528(G8987): At least 1 percent but less than 20 percent impaired, limited or restricted Self Care Goal Status (U1324(G8988): At least 1 percent but less than 20 percent impaired, limited or restricted Self Care Discharge Status [phone]): At least 1 percent but less than 20 percent impaired, limited or restricted   [facility] HospitalCharis Koya Hunger, OTR/L [phone]  Theodoro GristCharis M Rand Boller 01/01/2017, 10:36 AM

## 2017-01-01 NOTE — Op Note (Signed)
Laura [name], Laura [name]                   ACCOUNT NO.:  [id number]  MEDICAL RECORD NO.:  [id number]  LOCATION:                                 FACILITY:  PHYSICIAN:  Antony Contras, MD     DATE OF BIRTH:  1965/11/26  DATE OF PROCEDURE:  12/31/2016 DATE OF DISCHARGE:                              OPERATIVE REPORT   PREOPERATIVE DIAGNOSIS:  Right ear, multiple facial, and occipital scalp lacerations.  POSTOPERATIVE DIAGNOSIS:  Right ear, multiple facial, and occipital scalp lacerations.  PROCEDURE: 1. Complex closure of right ear laceration, measuring 14 cm. 2. Complex closure of eyelid lacerations totaling 8 cm. 3. Intermediate closure of right cheek and nasal dorsum lacerations     totaling 6 cm. 4. Simple closure of right cheek lacerations totaling 3 cm. 5. Simple closure of nasal lacerations totaling 2 cm. 6. Simple closure of occipital scalp laceration totaling 6 cm.  SURGEON:  Excell Seltzer. Jenne Pane, MD.  ANESTHESIA:  General endotracheal anesthesia.  COMPLICATIONS:  None.  INDICATION:  The patient is a 51 year old female, who was bitten by her pitbull earlier this morning, sustaining the above lacerations to the face and head as well as a left wrist injury.  Dr. Magnus Ivan was in the operating room working on her left wrist.  For details of that procedure, please see his operative note.  She was brought to the operative suite for surgical management of her wounds.  FINDINGS:  The right ear was partially avulsed involving the upper half of the ear starting at the helical root and extending into the postauricular space past the hairline, but the injury came across the conchal cartilage and there was a separate 2 cm area in the superior helical rim where there was another partial avulsion.  There were puncture lacerations in the right cheek and nasal root area.  There was a complex laceration of the lower eyelid that extended through the soft tissues of the maxilla and connected under  the skin to a puncture wound lateral to the oral commissure.  None of this into the mouth.  Globe was undisturbed.  The laceration of the scalp in the occipital region was through the skin and subcutaneous tissues.  DESCRIPTION OF PROCEDURE:  The patient was identified in the holding room and informed consent having been obtained including discussion of risks, benefits, and alternatives, the patient was brought to the operative suite and put on the operative table in supine position. Anesthesia was induced and the patient was intubated by Anesthesia team without difficulty.  The patient had received intravenous antibiotics in the emergency department and was re-dosed during the case in the operating room.  The left eye was taped closed and the face was prepped and draped in sterile fashion.  Attention was first directed to the right ear where the wound was copiously irrigated with saline.  Bleeding was controlled with bipolar electrocautery.  The ear was placed back in the normal position and secured with a couple of deep 3-0 Vicryl sutures at the helical root as well as under the ear to the underlying fascia. The subcutaneous layer was then closed using 4-0 Vicryl suture in a  simple interrupted fashion, bringing the skin edges together first at the conchal bowl and then across the helical root and then along the laceration superior to the root heading into the postauricular region. The skin was then closed with 5-0 Prolene suture in a simple interrupted and simple running fashion.  The additional 2 cm area of the superior helical rim was closed by folding and back under and closing the skin with 5-0 Prolene suture in a simple interrupted fashion.  At this point, attention was directed to the nasal root where lacerations were copiously irrigated with saline and explored with forceps.  In a couple of these places, the subcutaneous tissues were closed with 4-0 Vicryl suture in a simple  interrupted fashion and the skin was closed in all the puncture lacerations with 5-0 Prolene in a simple running and simple interrupted fashion.  Same was then done to the puncture wound in the cheek.  The eyelid was then addressed with copious irrigation of the wounds.  The skin was redraped across the eyelid region lining in up as best as possible and then closing in the orbicularis oculi muscle using 4-0 Vicryl suture as well as in the subcutaneous layer.  The skin was closed then in this whole area using the 5-0 Prolene in a simple running fashion.  There was a smaller flap laceration of the medial upper eyelid that was also closed with Prolene in a simple interrupted fashion. Bacitracin ointment was then added to all of the wounds.  At this point, drapes were removed and the patient was turned onto a left lateral decubitus position.  The occipital scalp was prepped with Betadine and draped.  The wound was copiously irrigated with saline and explored with forceps.  The skin was closed with a stapler.  At this point, the patient was returned to Anesthesia for wake up and extubated and moved to the recovery room in stable condition.     Antony Contraswight D [name], MD   ______________________________ Antony Contraswight D Shulamis Wenberg, MD    DDB/MEDQ  D:  12/31/2016  T:  12/31/2016  Job:  [id number]

## 2017-01-01 NOTE — Op Note (Signed)
[name]:  Aten, [name]                   ACCOUNT NO.:  [id number]  MEDICAL RECORD NO.:  [id number]  LOCATION:                                 FACILITY:  PHYSICIAN:  Antony Contraswight D Ha Shannahan, MD     DATE OF BIRTH:  [birth year]  DATE OF PROCEDURE:  12/31/2016 DATE OF DISCHARGE:                              OPERATIVE REPORT   ADDENDUM:  After closure of the ear lacerations, in order to drain the space, a quarter-inch Penrose drain was placed from the post auricularis end of the laceration underneath the ear and was secured to the skin using a 2-0 nylon suture.     Antony Contraswight D Clothilde Tippetts, MD   ______________________________ Antony Contraswight D Tanaysha Alkins, MD    DDB/MEDQ  D:  12/31/2016  T:  12/31/2016  Job:  [id number]

## 2017-01-01 NOTE — Progress Notes (Signed)
PT Cancellation Note  Patient Details Name: Laura [name] MRN: [id number] DOB: 1966/02/12   Cancelled Treatment:    Reason Eval/Treat Not Completed: PT screened, no needs identified, will sign off (Pt independent with all aspects of ambulation.) OT to address issues with ADL management given injuries to left hand. Thanks.  Amadeo GarnetDawn F Leverett Camplin 01/01/2017, 9:24 AM Eber Jonesawn Ahlani Wickes,PT Acute Rehabilitation [phone] (pager)

## 2017-01-01 NOTE — Progress Notes (Signed)
Patient ID: Ty HiltsLisa M Shupert, female   DOB: 01-Jun-1966, 51 y.o.   MRN: [id number] Comfortable this morning.  Left wrist splinted.  Fingers well perfused with normal sensation.  From an Ortho standpoint, if she is discharged today, will need to follow-up in one week.  Also needs to go home on at least oral amoxicillin 500 mg - two tablets twice daily for 2 weeks.

## 2017-01-01 NOTE — Progress Notes (Signed)
   Subjective:    Patient ID: Laura [name], female    DOB: 10-01-1966, 51 y.o.   MRN: [id number]  HPI Pain is well-controlled.  She is anxious thinking about the dog-bite event.  She has no other specific complaints.   Review of Systems     Objective:   Physical Exam Alert, NAD. Facial and scalp lacerations clean and intact.  Penrose removed from behind right ear.     Assessment & Plan:  Multiple facial and scalp lacerations s/p repair.  Continue wound care.  Follow-up in one week for suture and staple removal.

## 2017-01-01 NOTE — Progress Notes (Signed)
Occupational Therapy Treatment Patient Details Name: Laura [name] MRN: [id number] DOB: 12/17/1965 Today's Date: 01/01/2017    History of present illness Pt admitted on 12/31/16 s/p dog bites to face, Rt ear, scalp and Lt wrist.     OT comments  Provided pt with tubing for built up handle to increase independence with grooming and feeding. Educated pt on hand strengthening and provided squeeze ball. Pt verbalized understanding and reported that she was very tired from nausea medication. Completed education and pt safe to d/c home once medically stable per physician. Sign off OT.   Follow Up Recommendations  No OT follow up;Supervision - Intermittent    Equipment Recommendations  None recommended by OT    Recommendations for Other Services      Precautions / Restrictions         Mobility Bed Mobility               General bed mobility comments: Pt just recieved medication for nausea  Transfers                      Balance                                   ADL Overall ADL's : Needs assistance/impaired Eating/Feeding: Set up;Cueing for compensatory techinques;Sitting Eating/Feeding Details (indicate cue type and reason): benefit from tubing for utensil use Grooming: Set up;Cueing for compensatory techniques;Sitting   Upper Body Bathing: Minimal assistance;Sitting   Lower Body Bathing: Minimal assistance;Sit to/from stand   Upper Body Dressing : Minimal assistance;Sitting;Cueing for compensatory techniques Upper Body Dressing Details (indicate cue type and reason): Educated pt on compensatory strateigies Lower Body Dressing: Minimal assistance;Sit to/from stand;Cueing for compensatory techniques   Toilet Transfer: Supervision/safety;Ambulation   Toileting- Clothing Manipulation and Hygiene: Minimal assistance;Sit to/from stand;Cueing for compensatory techniques   Tub/ Shower Transfer: Min guard;Ambulation Tub/Shower Transfer Details (indicate  cue type and reason): RN informed pt to sponge bath initially Functional mobility during ADLs:  (Did not assess in session dueto increased nausea/throwing up) General ADL Comments: Provided pt will tubing for built up handle on toothbrush and utensils.      Vision                     Perception     Praxis      Cognition   Behavior During Therapy: WFL for tasks assessed/performed Overall Cognitive Status: Within Functional Limits for tasks assessed                         Exercises Hand Exercises Digit Composite Adduction: AROM;Strengthening;Supine;Squeeze ball   Shoulder Instructions       General Comments      Pertinent Vitals/ Pain       Pain Assessment: Faces Faces Pain Scale: Hurts little more Pain Location: Right ear, side of face, and left hand Pain Descriptors / Indicators: Constant Pain Intervention(s): Monitored during session;Limited activity within patient's tolerance  Home Living Family/patient expects to be discharged to:: Private residence Living Arrangements: Alone Available Help at Discharge: Family Type of Home: House       Home Layout: One level                          Prior Functioning/Environment Level of Independence: Independent              Frequency  Min 2X/week        Progress Toward Goals  OT Goals(current goals can now be found in the care plan section)  Progress towards OT goals: Goals met/education completed, patient discharged from OT  Acute Rehab OT Goals Patient Stated Goal: Get back to normal OT Goal Formulation: With patient Time For Goal Achievement: 01/15/17 Potential to Achieve Goals: Good  Plan Discharge plan remains appropriate    Co-evaluation                 End of Session    OT Visit Diagnosis: Other (comment) (UE injury)   Activity Tolerance Other (comment) (Treatment limited due to nausea and fatigue)   Patient Left in bed;with call bell/phone within reach    Nurse Communication Mobility status    Functional Assessment Tool Used: Clinical judgement Functional Limitation: Self care Self Care Current Status (G8987): At least 1 percent but less than 20 percent impaired, limited or restricted Self Care Goal Status (G8988): At least 1 percent but less than 20 percent impaired, limited or restricted Self Care Discharge Status (G8989): At least 1 percent but less than 20 percent impaired, limited or restricted   Time: 1309-1317 OT Time Calculation (min): 8 min  Charges: OT G-codes **NOT FOR INPATIENT CLASS** Functional Assessment Tool Used: Clinical judgement Functional Limitation: Self care Self Care Current Status (G8987): At least 1 percent but less than 20 percent impaired, limited or restricted Self Care Goal Status (G8988): At least 1 percent but less than 20 percent impaired, limited or restricted Self Care Discharge Status (G8989): At least 1 percent but less than 20 percent impaired, limited or restricted OT General Charges $OT Visit: 1 Procedure OT Evaluation $OT Eval Moderate Complexity: 1 Procedure OT Treatments $Self Care/Home Management : 8-22 mins  Charis Capehart, OTR/L [phone]    Charis M Capehart 01/01/2017, 1:25 PM    

## 2017-01-02 NOTE — Progress Notes (Signed)
2 Days Post-Op  Subjective: Feeling better Wants to go home  Objective: Vital signs in last 24 hours: Temp:  [98.3 F (36.8 C)-98.7 F (37.1 C)] 98.6 F (37 C) (02/24 0612) Pulse Rate:  [66-82] 72 (02/24 0612) Resp:  [17] 17 (02/24 0612) BP: (109-116)/(50-63) 113/63 (02/24 0612) SpO2:  [97 %-99 %] 99 % (02/24 0612) Last BM Date: 01/01/17  Intake/Output from previous day: 02/23 0701 - 02/24 0700 In: 3421.2 [P.O.:1052; I.V.:2369.2] Out: -  Intake/Output this shift: No intake/output data recorded.  Exam: Incisions all stable Looks comfortable  Lab Results:   Recent Labs  12/31/16 0819 12/31/16 0836 01/01/17 0427  WBC 11.2*  --  9.9  HGB 10.8* 11.9* 8.3*  HCT 34.9* 35.0* 26.4*  PLT 296  --  228   BMET  Recent Labs  12/31/16 0819 12/31/16 0836 01/01/17 0427  NA 138 142 137  K 3.7 3.7 3.9  CL 108 108 108  CO2 23  --  23  GLUCOSE 108* 106* 133*  BUN 18 22* 15  CREATININE 1.06* 1.10* 0.93  CALCIUM 8.7*  --  8.2*   PT/INR  Recent Labs  12/31/16 0819  LABPROT 13.1  INR 0.99   ABG No results for input(s): PHART, HCO3 in the last 72 hours.  Invalid input(s): PCO2, PO2  Studies/Results: No results found.  Anti-infectives: Anti-infectives    Start     Dose/Rate Route Frequency Ordered Stop   01/01/17 1300  amoxicillin (AMOXIL) capsule 500 mg     500 mg Oral Every 12 hours 01/01/17 1123     01/01/17 0000  amoxicillin (AMOXIL) 500 MG capsule     500 mg Oral Every 12 hours 01/01/17 1327 01/15/17 2359   12/31/16 1115  piperacillin-tazobactam (ZOSYN) IVPB 3.375 g     3.375 g 100 mL/hr over 30 Minutes Intravenous To Surgery 12/31/16 1104 12/31/16 1109   12/31/16 0830  ceFAZolin (ANCEF) IVPB 2g/100 mL premix     2 g 200 mL/hr over 30 Minutes Intravenous  Once 12/31/16 0827 12/31/16 0904      Assessment/Plan: s/p Procedure(s): CLOSURE OF FACIAL LACERATION (N/A) IRRIGATION AND DEBRIDEMENT WOUND (Left)  Discharge home  LOS: 0 days     Lashanna Angelo A 01/02/2017

## 2017-01-02 NOTE — Progress Notes (Signed)
Discharge home. Home discharge instruction given, no question verbalized. 

## 2017-01-02 NOTE — Discharge Instructions (Signed)
Keep your left wrist splint/dressing clean and dry. No heavy lifting with your left wrist. No work until further notice.  Ok to shower and shampoo starting today

## 2017-01-02 NOTE — Clinical Social Work Note (Signed)
Clinical Social Work Assessment  Patient Details  Name: Laura HiltsLisa M Brayfield MRN: [id number] Date of Birth: 1966-01-26  Date of referral:  01/02/17               Reason for consult:  Trauma                Permission sought to share information with:    Permission granted to share information::     Name::        Agency::     Relationship::     Contact Information:     Housing/Transportation Living arrangements for the past 2 months:  Single Family Home Source of Information:  Patient Patient Interpreter Needed:  None Criminal Activity/Legal Involvement Pertinent to Current Situation/Hospitalization:  No - Comment as needed Significant Relationships:  Adult Children Lives with:  Self Do you feel safe going back to the place where you live?  Yes Need for family participation in patient care:  No (Coment)  Care giving concerns:  CSW consulted regarding Trauma status. Patient reported that she was attacked in the morning by her dog, who was on the bed with her. Patient stated that she was sad and knew it would take time for her to get better physically and emotionally. She had to put the dog down. He had bitten the patient's grandchild in the past, but she considered the dog her family. Patient reported that she didn't want anyone to see her face right now. 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 CSW [facility]. CSW completed SBIRT with patient. Patient denied any alcohol or drug use ever.    Social Worker assessment / plan:  Patient will return home at discharge.   Employment status:  [employer] Insurance information:  Managed Care PT Recommendations:  Not assessed at this time Information / Referral to community resources:  Support Groups  Patient/Family's Response to care:  Patient is struggling with her condition right now, but has a lot of family support to get through this difficult time. Patient was pleasant and showed CSW  a picture of one of her grandchildren.    Patient/Family's Understanding of and Emotional Response to Diagnosis, Current Treatment, and Prognosis:  Patient expressed understanding of CSW role and discharge process. No questions/concerns about plan or treatment.    Emotional Assessment Appearance:  Appears stated age Attitude/Demeanor/Rapport:  Other (Appropriate) Affect (typically observed):  Accepting, Appropriate, Sad Orientation:  Oriented to Self, Oriented to Situation, Oriented to Place, Oriented to  Time Alcohol / Substance use:  Not Applicable Psych involvement (Current and /or in the community):  No (Comment)  Discharge Needs  Concerns to be addressed:  No discharge needs identified Readmission within the last 30 days:  No Current discharge risk:  None Barriers to Discharge:  No Barriers Identified   Mearl Latinadia S Remington Skalsky, LCSWA 01/02/2017, 11:40 AM

## 2017-01-04 NOTE — Discharge Summary (Signed)
[facility] Discharge Summary   Patient ID: Laura HiltsLisa M Zazueta MRN: [id number] DOB/AGE: [birth year] 51 y.o.  Admit date: 12/31/2016 Discharge date: 01/04/2017  Admitting Diagnosis: Dog bite  Discharge Diagnosis Patient Active Problem List   Diagnosis Date Noted  . Dog bite 12/31/2016  . Closed fracture of left distal radius   . Attention deficit disorder without mention of hyperactivity 09/01/2013  . Attention deficit disorder with hyperactivity(314.01) 03/10/2013  . ADD (attention deficit disorder) 01/23/2013   Consultants Otolaryngology/ENT - Dr. Christia Readingwight Bates Orthopedic surgery - Dr. Doneen Poissonhristopher Blackman  Imaging: 12/31/16 DG Chest - There is no acute cardiopulmonary abnormality.  12/31/16 CT HEAD/C-SPINE/ MAXILLOFACIAL WO CONTRAST -  No evidence of acute intracranial injury. No evidence of acute injury to the cervical spine. No evidence of facial fractures. Extensive soft tissue trauma to the right side of the face, including right preseptal hematoma. Soft tissue emphysema tracks within the soft tissues of the right face and into the nasal soft tissues. Severe right ear laceration.  Right parotid gland is intact.  12/31/16  DG Forearm, Left - Apparent cortical puncture fracture of the volar distal radius. Question small loose body or bone fragment.  12/31/16 DG Wrist Complete, Left - Puncture fracture of the volar distal radial metaphysis. Question bone fragment or foreign object nearby.  Procedures Dr. Christia Readingwight Bates (12/31/16) -  1. Complex closure of right ear laceration, measuring 14 cm. 2. Complex closure of eyelid lacerations totaling 8 cm. 3. Intermediate closure of right cheek and nasal dorsum lacerations     totaling 6 cm. 4. Simple closure of right cheek lacerations totaling 3 cm. 5. Simple closure of nasal lacerations totaling 2 cm. 6. Simple closure of occipital scalp laceration totaling 6 cm.  Dr. Doneen Poissonhristopher Blackman (12/31/16) -  Irrigation and  debridement of left dorsal and volar wrist wounds including debridement of skin only; sharp excisional debridement of skin only; and irrigation of skin, soft tissue, fascia, and bone.  Hospital Course:  Ms. Laura [name] is a 51 year-old female [facility] after she was attacked by her pitbull at home .  Workup significant for right ear avulsion, multiple facial and scalp lacerations, and left wrists laceration with punctate fracture of distal radius.  Patient was admitted and underwent procedures listed above.  Tolerated procedure well and was transferred to the floor.  Diet was advanced as tolerated.  On POD#2, the patient was voiding well, tolerating diet, ambulating well, pain well controlled, vital signs stable, incisions c/d/i and felt stable for discharge home.  Patient will follow up with ENT and orthopedic surgery in 1-2 weeks.   Physical Exam: General:  Alert, NAD, cooperative HEENT: Right ear 50, Penrose drain in place, some edema is present Abd:  Soft, nontender, nondistended, normal bowel sounds Extremities: Splint left wrist  Allergies as of 01/02/2017      Reactions   Flagyl [metronidazole] Rash      Medication List    STOP taking these medications   traMADol 50 MG tablet Commonly known as:  ULTRAM   vancomycin 125 MG capsule Commonly known as:  VANCOCIN     TAKE these medications   acetaminophen 325 MG tablet Commonly known as:  TYLENOL Take 2 tablets (650 mg total) by mouth every 4 (four) hours as needed for mild pain.   amoxicillin 500 MG capsule Commonly known as:  AMOXIL Take 1 capsule (500 mg total) by mouth every 12 (twelve) hours.   bacitracin ointment Apply topically 2 (two)  times daily.   HYDROcodone-acetaminophen 5-325 MG tablet Commonly known as:  NORCO/VICODIN Take 1 tablet by mouth every 4 (four) hours as needed for moderate pain.   lisdexamfetamine 70 MG capsule Commonly known as:  VYVANSE Fill after 3/5 /2016   loperamide  2 MG tablet Commonly known as:  IMODIUM A-D 2 now and one hourly prn diarrhea.  Max 8 tabs in 24 hours   norethindrone 5 MG tablet Commonly known as:  AYGESTIN Take 10 mg by mouth daily.   ondansetron 8 MG disintegrating tablet Commonly known as:  ZOFRAN-ODT Take 1 tablet (8 mg total) by mouth every 8 (eight) hours as needed for nausea.        Follow-up Information    Kathryne Hitch, MD. Schedule an appointment as soon as possible for a visit in 1 week(s).   Specialty:  Orthopedic Surgery Contact information: [address] [place] [address] [phone]        Christia Reading, MD. Schedule an appointment as soon as possible for a visit in 1 week(s).   Specialty:  Otolaryngology Contact information: [address] [place] [address] [phone]           Signed: Hosie Spangle, [facility] 01/04/2017, 4:15 PM Pager: [phone] Consults: [phone] Mon-Fri 7:00 am-4:30 pm Sat-Sun 7:00 am-11:30 am

## 2017-01-05 NOTE — Anesthesia Postprocedure Evaluation (Addendum)
Anesthesia Post Note  Patient: Laura [name]  Procedure(s) Performed: Procedure(s) (LRB): CLOSURE OF FACIAL LACERATION (N/A) IRRIGATION AND DEBRIDEMENT WOUND (Left)  Patient location during evaluation: PACU Anesthesia Type: General Level of consciousness: awake Pain management: pain level controlled Vital Signs Assessment: post-procedure vital signs reviewed and stable Respiratory status: spontaneous breathing Cardiovascular status: stable Postop Assessment: no signs of nausea or vomiting Anesthetic complications: no        Last Vitals:  Vitals:   01/01/17 2143 01/02/17 0612  BP: (!) 109/50 113/63  Pulse: 82 72  Resp: 17 17  Temp: 36.8 C 37 C    Last Pain:  Vitals:   01/02/17 0856  TempSrc:   PainSc: 2    Pain Goal:                 Nassim Cosma JR,JOHN Timmy Cleverly

## 2017-01-06 ENCOUNTER — Ambulatory Visit (INDEPENDENT_AMBULATORY_CARE_PROVIDER_SITE_OTHER): Payer: Self-pay | Admitting: Physician Assistant

## 2017-01-13 ENCOUNTER — Ambulatory Visit (INDEPENDENT_AMBULATORY_CARE_PROVIDER_SITE_OTHER): Payer: 59 | Admitting: Orthopaedic Surgery

## 2017-01-13 DIAGNOSIS — S52532D Colles' fracture of left radius, subsequent encounter for closed fracture with routine healing: Secondary | ICD-10-CM

## 2017-01-13 NOTE — Progress Notes (Signed)
The patient is following up at 2 weeks after injury to her right wrist from an unfortunate accident involving her dog. The dog did bite her wrist and caused a nondisplaced fracture. She was taken to the operating room where she underwent irrigation and debridement of wounds on the volar and dorsal aspect of her left nondominant wrist. She's been in a soft dressing since then. She is doing well. She's been feeling symptoms of a concussion since she had a fall last week hitting her head after taking narcotics. She also had extensive soft tissue wounds to her face that were repaired by ENT.  On examination she is alert and oriented 3. Her neurologic exam is normal. Examination of her left wrist shows incisions are healed nicely. There is no evidence infection. She moves her fingers and thumb well. She is neurovascular intact with her left hand. I removed the sutures and place Steri-Strips. She does have a nondisplaced distal radius fracture. I did not x-ray her today.  I gave her a note keeping her out of work the next 4 weeks. I gave her removal wrist splint as well. She'll come in and out of the splint as comfort allows and work on range of motion of her wrist. I'll see her back in 3 weeks I like an AP and lateral of her left wrist at that visit.

## 2017-01-20 ENCOUNTER — Telehealth (INDEPENDENT_AMBULATORY_CARE_PROVIDER_SITE_OTHER): Payer: Self-pay | Admitting: Radiology

## 2017-01-20 NOTE — Telephone Encounter (Signed)
I called patient and left voice mail advising that I have completed her disability form and faxed it in to [employer], as well as put a copy of all I sent in the mail to her.  I have sent her the money order as well.

## 2017-02-03 ENCOUNTER — Ambulatory Visit (INDEPENDENT_AMBULATORY_CARE_PROVIDER_SITE_OTHER): Payer: 59

## 2017-02-03 ENCOUNTER — Ambulatory Visit (INDEPENDENT_AMBULATORY_CARE_PROVIDER_SITE_OTHER): Payer: 59 | Admitting: Orthopaedic Surgery

## 2017-02-03 DIAGNOSIS — M25532 Pain in left wrist: Secondary | ICD-10-CM

## 2017-02-03 DIAGNOSIS — W540XXA Bitten by dog, initial encounter: Secondary | ICD-10-CM

## 2017-02-03 DIAGNOSIS — S52532D Colles' fracture of left radius, subsequent encounter for closed fracture with routine healing: Secondary | ICD-10-CM

## 2017-02-03 NOTE — Progress Notes (Signed)
The patient continues to follow-up from injury she sustained when her dog attacked her. From an orthopedic standpoint she sustained a nondisplaced extra articular left distal radius fracture as well as white wounds on the dorsum and volar aspect of her left wrist. She does have some tingling around her thumb but overall feels like she is improving. She's been wearing a removable wrist splint.  On examination all incisions of all healed nicely. There is no is infection at all. She is subjective numbness around the palmar wound. She is moving her wrist better as well. X-rays of the left distal radius do confirm healing of her fracture with no significant malalignment.  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 When I see her back at that visit in 4 weeks from now no x-rays are needed.

## 2017-02-15 ENCOUNTER — Encounter (INDEPENDENT_AMBULATORY_CARE_PROVIDER_SITE_OTHER): Payer: Self-pay | Admitting: Radiology

## 2017-02-23 ENCOUNTER — Telehealth (INDEPENDENT_AMBULATORY_CARE_PROVIDER_SITE_OTHER): Payer: Self-pay | Admitting: Orthopaedic Surgery

## 2017-02-23 NOTE — Telephone Encounter (Signed)
I FAXED 02/03/2017 PROGRESS NOTE TO  [employer] [phone], ATTN DONNA MARKLE, AS PER PTS SIGNED AUTHORIZATION

## 2017-03-08 ENCOUNTER — Ambulatory Visit (INDEPENDENT_AMBULATORY_CARE_PROVIDER_SITE_OTHER): Payer: 59 | Admitting: Orthopaedic Surgery

## 2017-03-08 ENCOUNTER — Encounter (INDEPENDENT_AMBULATORY_CARE_PROVIDER_SITE_OTHER): Payer: Self-pay | Admitting: Orthopaedic Surgery

## 2017-03-08 DIAGNOSIS — S52532D Colles' fracture of left radius, subsequent encounter for closed fracture with routine healing: Secondary | ICD-10-CM

## 2017-03-08 NOTE — Progress Notes (Signed)
The patient is here in follow-up for severe injury that occurred to her left wrist when she was attacked by her own dog on to 22 2018. She did sustain facial injuries that were taken care of by other physicians. From an orthopedic standpoint she sustained numerous dog bites on the dorsal and volar aspect of her left wrist as well as an extra-articular distal radius fracture. It is now been 67 days or around 9 weeks since her injury. When I saw her at her last visit I did not feel comfortable with her returning to her job at all that she was still healing the brake. This is between 4 and 5 weeks post injury. X-rays at that visit showed that the alignment was well maintained of the fracture had not healed completely. However I did feel that it was going to eventually heal and she did not need repeat x-rays today. However I needed to keep her out of work due to the fact that no adult can heel and significant fracture that quickly. I was concerned about inflammation around the wrist. Otherwise did not dictate her range of motion deficits she lacked full palmar flexion and full dorsiflexion by at least 10-15. She had weak pinching grip strength of the time as well. I did not feel comfortable with her returning to work based over those findings.  Today she comes in still using a Velcro wrist splint. She is trying not to wear at some and she still has some pain specialist gripping and writing but overall she feels like she is making improvements. On examination of her left wrist there is no redness around her incisions and they have well-healed. Her palmar flexion and dorsiflexion are almost full of the left wrist joint. Her pronation and supination is full. She still has slightly weak pinching grip strength but they are improving.  At this point I am now comfortable allowing her to return to work duties. Again at her last visit with me back on 02/03/2017 I was in no way comfortable allowing her to return to work  given the need to continue to heal her wrist. At this point again she will follow-up as needed.

## 2017-04-16 NOTE — Addendum Note (Signed)
Addendum  created 04/16/17 1031 by Cheryln Balcom, MD   Sign clinical note    

## 2018-03-28 IMAGING — CT CT HEAD W/O CM
3 series · 14 of 47 positions shown, 16 images · non-contrast
Comparison: None.

CLINICAL DATA: Dog bite to the right side of the face with severe
right ear laceration.

EXAM:
CT HEAD WITHOUT CONTRAST
CT MAXILLOFACIAL WITHOUT CONTRAST
CT CERVICAL SPINE WITHOUT CONTRAST
TECHNIQUE: Contiguous axial images were obtained from the base of the skull
through the vertex without intravenous contrast. Multidetector CT
imaging of the maxillofacial structures was performed. Multiplanar
CT image reconstructions were also generated. A small metallic BB
was placed on the right temple in order to reliably differentiate
right from left. Multidetector CT imaging of the cervical spine was
performed without intravenous contrast. Multiplanar CT image
reconstructions were also generated.

[Series 3: head 5.0 h30s · axial · 0.46mm/px · z∈[-72,+53]mm · 8 of 30 slices shown, 10 images]
[im 3/30  brain]
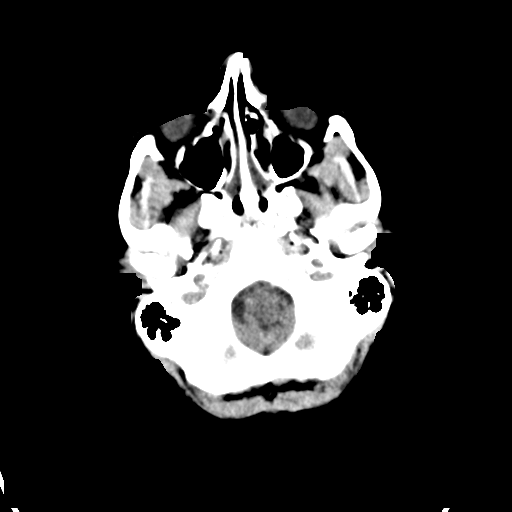
[im 3/30  bone]
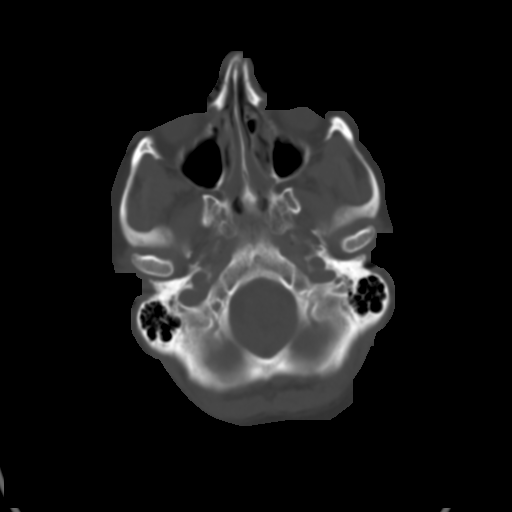
[im 7/30  brain]
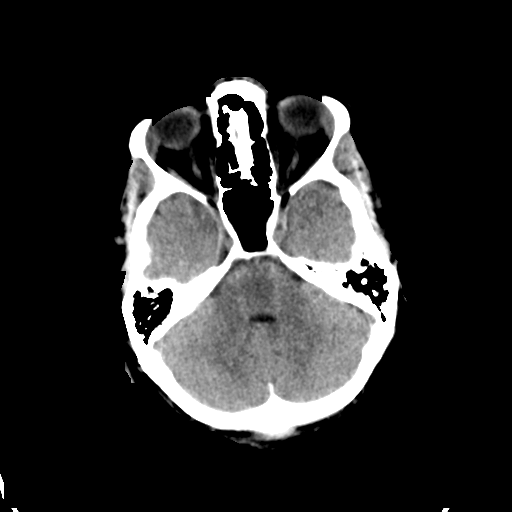
[im 10/30  brain]
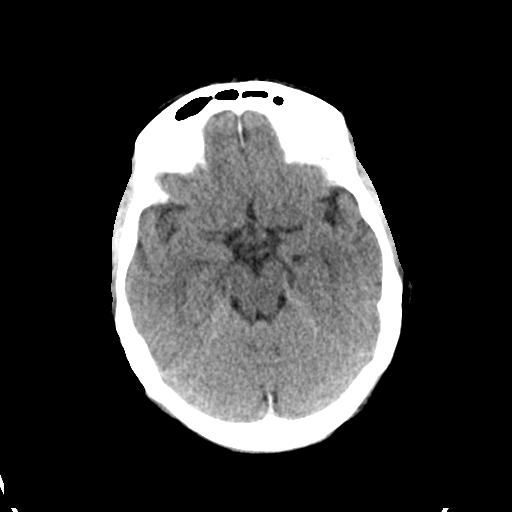
[im 14/30  brain]
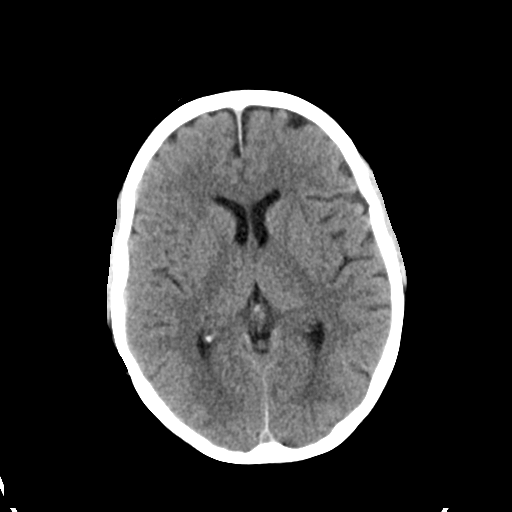
[im 17/30  brain]
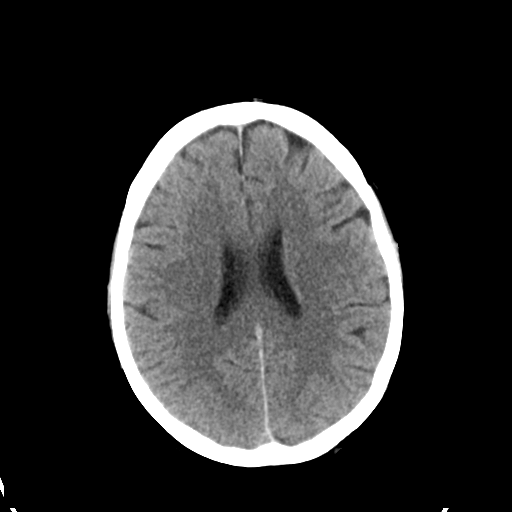
[im 17/30  bone]
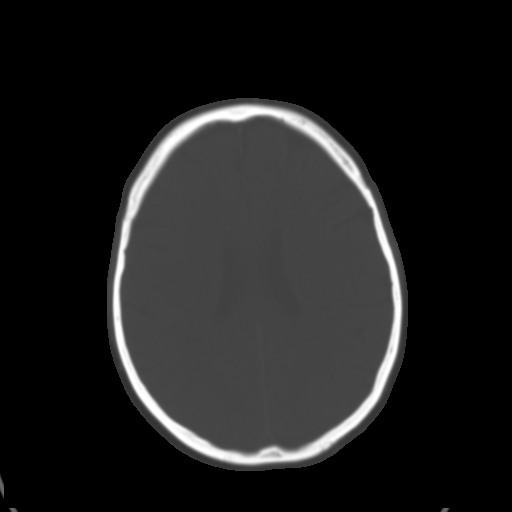
[im 21/30  brain]
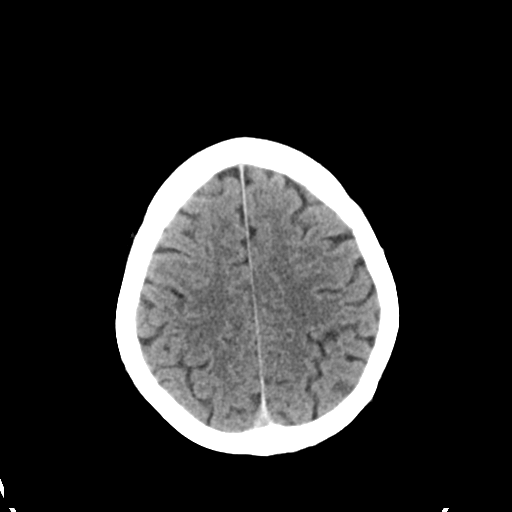
[im 24/30  brain]
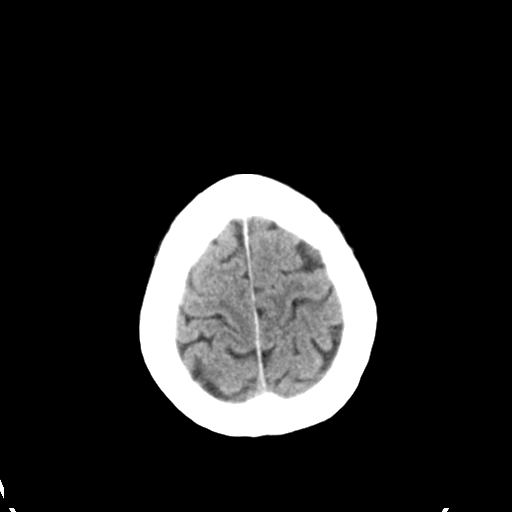
[im 28/30  brain]
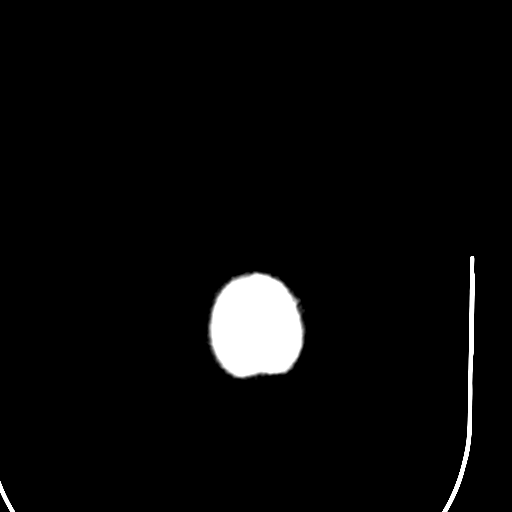

[Series 5: head 3.0 mpr cor · coronal · 0.28mm/px · 3 of 67 slices shown]
[im 23/67  brain]
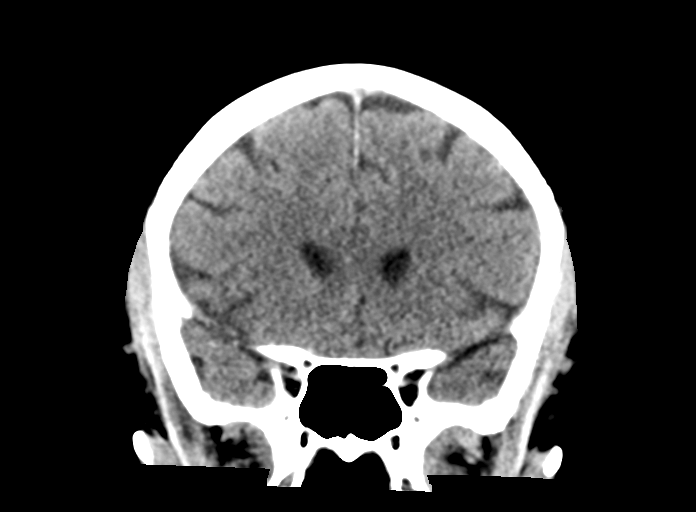
[im 30/67  brain]
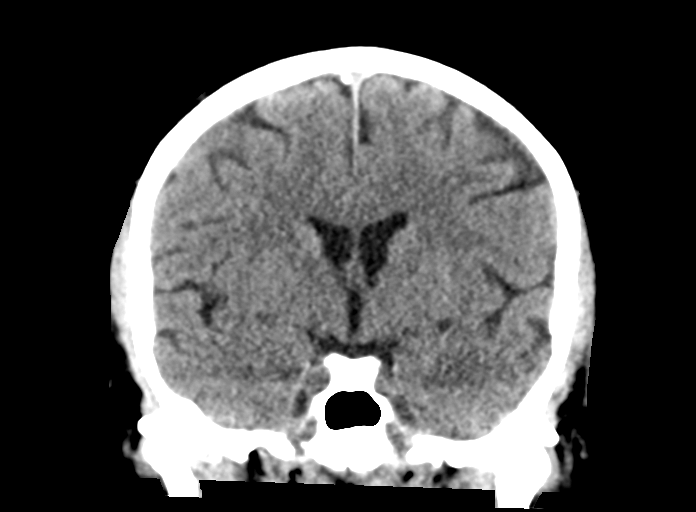
[im 37/67  brain]
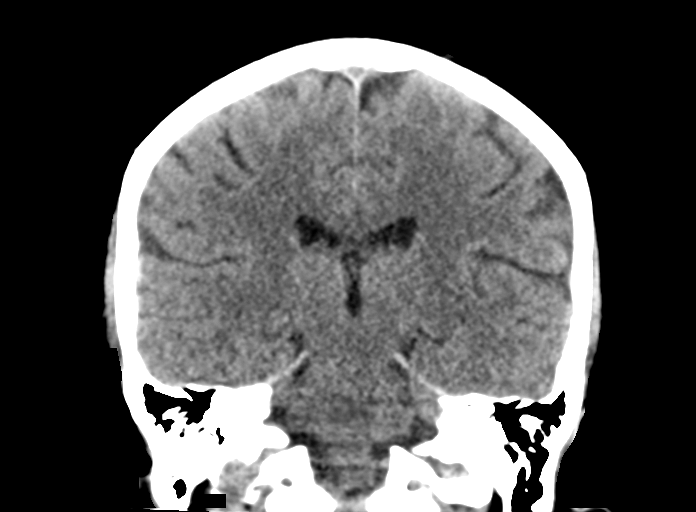

[Series 6: head 3.0 mpr sag · sagittal · 0.29mm/px · 3 of 67 slices shown]
[im 23/67  brain]
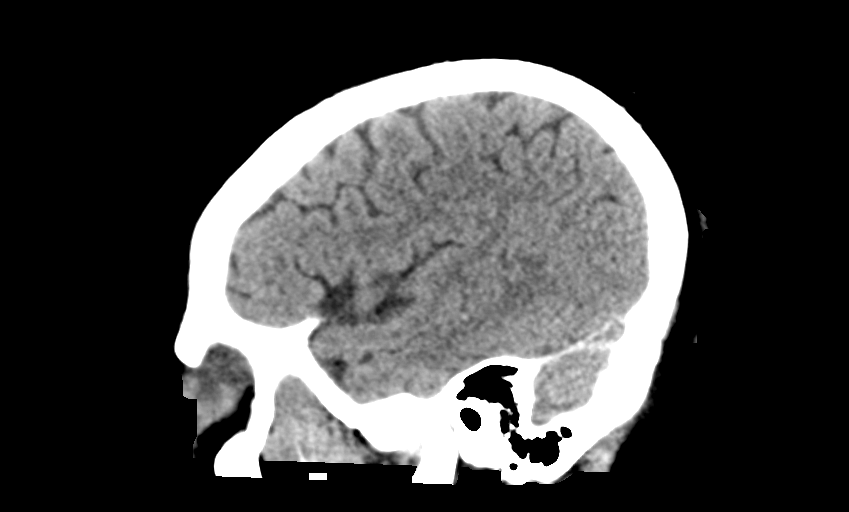
[im 34/67  brain]
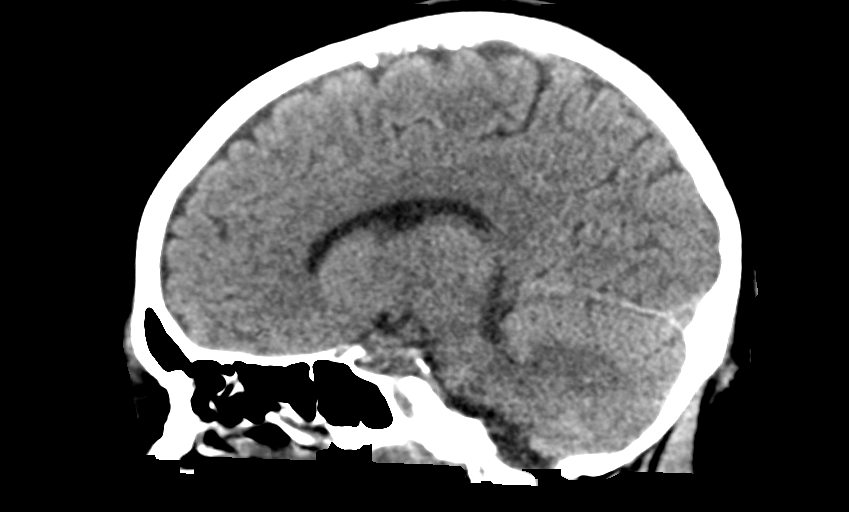
[im 45/67  brain]
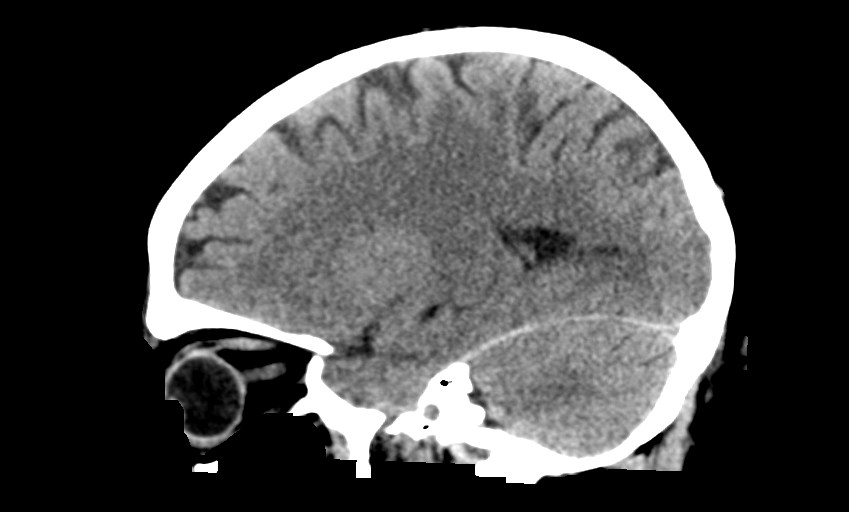

[14 of 47 positions shown; findings below may reference images not displayed]

FINDINGS: CT HEAD FINDINGS

Brain: No evidence of acute infarction, hemorrhage, hydrocephalus,
extra-axial collection or mass lesion/mass effect.

Vascular: No hyperdense vessel or unexpected calcification.

Skull: Normal. Negative for fracture or focal lesion.

Other: Right-sided facial hematoma.

CT MAXILLOFACIAL FINDINGS

Osseous: No fracture or mandibular dislocation. No destructive
process.

Orbits: Negative. No traumatic or inflammatory finding. Right
periorbital hematoma.

Sinuses: Clear.

Soft tissues: Hematoma to the right sided of the face with small
amounts of soft tissue emphysema tracking into the right perinasal
soft tissues. Severe laceration of the right ear. Normal appearance
of the right parotid gland.

CT CERVICAL SPINE FINDINGS

Alignment: Mild straightening of the cervical lordosis.

Skull base and vertebrae: No acute fracture. No primary bone lesion
or focal pathologic process.

Soft tissues and spinal canal: No prevertebral fluid or swelling. No
visible canal hematoma.

Disc levels:  No significant degenerative disc disease.

Upper chest: Negative.

Other: None.
IMPRESSION: No evidence of acute intracranial injury.

No evidence of acute injury to the cervical spine.

No evidence of facial fractures.

Extensive soft tissue trauma to the right side of the face,
including right preseptal hematoma. Soft tissue emphysema tracks
within the soft tissues of the right face and into the nasal soft
tissues.

Severe right ear laceration.  Right parotid gland is intact.

## 2018-05-03 ENCOUNTER — Ambulatory Visit
Admission: RE | Admit: 2018-05-03 | Discharge: 2018-05-03 | Disposition: A | Discharge status: Home / Self Care | Payer: Self-pay | Source: Ambulatory Visit | Attending: Family Medicine | Admitting: Family Medicine

## 2018-05-03 ENCOUNTER — Other Ambulatory Visit: Payer: Self-pay | Admitting: Family Medicine

## 2018-05-03 DIAGNOSIS — R52 Pain, unspecified: Secondary | ICD-10-CM

## 2021-11-24 ENCOUNTER — Other Ambulatory Visit: Payer: Self-pay

## 2021-11-24 ENCOUNTER — Ambulatory Visit (INDEPENDENT_AMBULATORY_CARE_PROVIDER_SITE_OTHER): Payer: 59 | Admitting: Orthopaedic Surgery

## 2021-11-24 ENCOUNTER — Encounter: Payer: Self-pay | Admitting: Orthopaedic Surgery

## 2021-11-24 DIAGNOSIS — M79644 Pain in right finger(s): Secondary | ICD-10-CM | POA: Diagnosis not present

## 2021-11-24 MED ORDER — PREDNISONE 50 MG PO TABS: ORAL_TABLET | ORAL | 0 refills | Status: AC

## 2021-11-24 NOTE — Progress Notes (Signed)
The patient comes in today for evaluation treatment of right thumb pain.  She actually points to the A1 pulley as a source of her pain but there is been no triggering.  She does have triggering of her left dominant hand ring finger but no pain in that area.  She is someone that I have not seen in a long period of time.  I originally saw her for dog bites on her left forearm.  She says that is done very well.  She does work at [employer].  She does report stiffness in all of her joints when she gets up from a sitting position.  She has a history of a hysterectomy and wonders if it is hormone related.  She denies any numbness and tingling in her fingers.  She denies any other acute change in medical status.  She has been wearing a splint on her right thumb more to protect the thumb from hitting against things.  There is no pain over the basilar thumb joint on the right side but there is significant pain over the A1 pulley but no triggering.  Her thumb is neurovascularly intact.  We did not obtain x-rays today.  This seems like this is an inflammatory process over the A1 pulley.  I did recommend at least trying oral steroids first and Voltaren gel over this area 2-3 times a day.  We can see her back in 2 weeks because if she is not getting better my next step would be considering a steroid injection over the right thumb A1 pulley.  All question concerns were answered addressed.  She agrees with this treatment plan.

## 2021-12-08 ENCOUNTER — Encounter: Payer: Self-pay | Admitting: Orthopaedic Surgery

## 2021-12-08 ENCOUNTER — Ambulatory Visit (INDEPENDENT_AMBULATORY_CARE_PROVIDER_SITE_OTHER): Payer: 59 | Admitting: Orthopaedic Surgery

## 2021-12-08 DIAGNOSIS — M79644 Pain in right finger(s): Secondary | ICD-10-CM | POA: Diagnosis not present

## 2021-12-08 DIAGNOSIS — M65311 Trigger thumb, right thumb: Secondary | ICD-10-CM

## 2021-12-08 MED ORDER — LIDOCAINE HCL 1 % IJ SOLN
0.5000 mL | INTRAMUSCULAR | Status: AC | PRN
  Administered 2021-12-08: .5 mL

## 2021-12-08 MED ORDER — METHYLPREDNISOLONE ACETATE 40 MG/ML IJ SUSP
40.0000 mg | INTRAMUSCULAR | Status: AC | PRN
  Administered 2021-12-08: 40 mg

## 2021-12-08 NOTE — Progress Notes (Signed)
Office Visit Note   Patient: Laura [name]           Date of Birth: [birth year]           MRN: [id number] Visit Date: 12/08/2021              Requested by: No referring provider defined for this encounter. PCP: Patient, No Pcp Per (Inactive)   Assessment & Plan: Visit Diagnoses:  1. Pain of right thumb   2. Trigger thumb, right thumb     Plan: I did recommend a steroid injection over the right thumb A1 pulley and she agreed to this and tolerated well.  I would not mind repeating this again in 6 to 8 weeks if she needs it.  All questions and concerns were answered addressed.  Follow-up is as needed.  Follow-Up Instructions: Return if symptoms worsen or fail to improve.   Orders:  Orders Placed This Encounter  Procedures   Hand/UE Inj   No orders of the defined types were placed in this encounter.     Procedures: Hand/UE Inj: R thumb A1 for trigger finger on 12/08/2021 10:18 AM Medications: 40 mg methylPREDNISolone acetate 40 MG/ML; 0.5 mL lidocaine 1 %     Clinical Data: No additional findings.   Subjective: Chief Complaint  Patient presents with   Right Thumb - Follow-up  The patient comes in today as relates to her right thumb.  She still having pain over the A1 pulley.  We tried just an oral steroid and Voltaren gel and she still having some problems with triggering and pain.  HPI  Review of Systems There is no fever, chills, nausea, vomiting  Objective: Vital Signs: There were no vitals taken for this visit.  Physical Exam She is alert and orient x3 and in no acute distress Ortho Exam Examination of her right hand shows pain over the right thumb A1 pulley. Specialty Comments:  No specialty comments available.  Imaging: No results found.   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   ADD (attention deficit disorder) 01/23/2013   Past Medical History:  Diagnosis Date   Allergy    Anxiety    Depression     Family History  Problem Relation Age of Onset   Stroke Mother    Scoliosis Mother    Gallbladder disease Mother    Anxiety disorder Mother    Depression Mother    Heart disease Maternal Grandmother    Stroke Maternal Grandmother    Cancer Paternal Grandmother        breast   Heart disease Paternal Grandfather    GER disease Daughter    Gallbladder disease Daughter     Past Surgical History:  Procedure Laterality Date   BUNIONECTOMY  2004   R foot   INCISION AND DRAINAGE OF WOUND Left 12/31/2016   Procedure: IRRIGATION AND DEBRIDEMENT WOUND;  Surgeon: Mcarthur Rossetti, MD;  Location: [place];  Service: Orthopedics;  Laterality: Left;   LACERATION REPAIR N/A 12/31/2016   Procedure: CLOSURE OF FACIAL LACERATION;  Surgeon: Melida Quitter, MD;  Location: [facility] OR;  Service: ENT;  Laterality: N/A;   Social History   Occupational History   Not on file  Tobacco Use   Smoking status: Never   Smokeless tobacco:  Never  Substance and Sexual Activity   Alcohol use: No   Drug use: No   Sexual activity: Never
